# Patient Record
Sex: Female | Born: 1975 | Race: White | Hispanic: No | Marital: Married | Smoking: Never smoker
Health system: Southern US, Community
[De-identification: ages and names within clinical notes are randomized; demographics above are authoritative.]

## PROBLEM LIST (undated history)

## (undated) DIAGNOSIS — D649 Anemia, unspecified: Secondary | ICD-10-CM

## (undated) DIAGNOSIS — K219 Gastro-esophageal reflux disease without esophagitis: Secondary | ICD-10-CM

## (undated) DIAGNOSIS — N920 Excessive and frequent menstruation with regular cycle: Secondary | ICD-10-CM

## (undated) DIAGNOSIS — U071 COVID-19: Secondary | ICD-10-CM

## (undated) DIAGNOSIS — Z923 Personal history of irradiation: Secondary | ICD-10-CM

## (undated) DIAGNOSIS — Z973 Presence of spectacles and contact lenses: Secondary | ICD-10-CM

## (undated) DIAGNOSIS — D219 Benign neoplasm of connective and other soft tissue, unspecified: Secondary | ICD-10-CM

## (undated) HISTORY — PX: TUBAL LIGATION: SHX77

## (undated) HISTORY — DX: Benign neoplasm of connective and other soft tissue, unspecified: D21.9

## (undated) HISTORY — DX: Anemia, unspecified: D64.9

---

## 2001-02-15 ENCOUNTER — Other Ambulatory Visit: Admission: RE | Admit: 2001-02-15 | Discharge: 2001-02-15 | Payer: Self-pay | Admitting: Obstetrics and Gynecology

## 2001-07-01 ENCOUNTER — Encounter: Payer: Self-pay | Admitting: *Deleted

## 2001-07-01 ENCOUNTER — Ambulatory Visit (HOSPITAL_COMMUNITY): Admission: RE | Admit: 2001-07-01 | Discharge: 2001-07-01 | Payer: Self-pay | Admitting: *Deleted

## 2001-09-14 ENCOUNTER — Inpatient Hospital Stay (HOSPITAL_COMMUNITY): Admission: AD | Admit: 2001-09-14 | Discharge: 2001-09-17 | Payer: Self-pay | Admitting: Obstetrics and Gynecology

## 2001-10-18 ENCOUNTER — Other Ambulatory Visit: Admission: RE | Admit: 2001-10-18 | Discharge: 2001-10-18 | Payer: Self-pay | Admitting: Obstetrics and Gynecology

## 2006-08-17 HISTORY — PX: TUBAL LIGATION: SHX77

## 2019-04-19 ENCOUNTER — Encounter: Payer: Self-pay | Admitting: *Deleted

## 2019-05-19 ENCOUNTER — Encounter: Payer: Self-pay | Admitting: Obstetrics and Gynecology

## 2019-05-26 ENCOUNTER — Ambulatory Visit (INDEPENDENT_AMBULATORY_CARE_PROVIDER_SITE_OTHER): Payer: Self-pay | Admitting: Obstetrics and Gynecology

## 2019-05-26 ENCOUNTER — Other Ambulatory Visit: Payer: Self-pay

## 2019-05-26 ENCOUNTER — Encounter: Payer: Self-pay | Admitting: Obstetrics and Gynecology

## 2019-05-26 ENCOUNTER — Other Ambulatory Visit (HOSPITAL_COMMUNITY)
Admission: RE | Admit: 2019-05-26 | Discharge: 2019-05-26 | Disposition: A | Discharge status: Home / Self Care | Payer: Self-pay | Source: Ambulatory Visit | Attending: Obstetrics and Gynecology | Admitting: Obstetrics and Gynecology

## 2019-05-26 VITALS — BP 126/77 | HR 110 | Temp 98.5°F | Ht 61.0 in | Wt 119.9 lb

## 2019-05-26 DIAGNOSIS — N921 Excessive and frequent menstruation with irregular cycle: Secondary | ICD-10-CM | POA: Insufficient documentation

## 2019-05-26 DIAGNOSIS — N840 Polyp of corpus uteri: Secondary | ICD-10-CM

## 2019-05-26 DIAGNOSIS — D649 Anemia, unspecified: Secondary | ICD-10-CM | POA: Insufficient documentation

## 2019-05-26 MED ORDER — NORGESTIMATE-ETH ESTRADIOL 0.25-35 MG-MCG PO TABS: 1.0000 | ORAL_TABLET | Freq: Every day | ORAL | 3 refills | Status: DC

## 2019-05-26 NOTE — Progress Notes (Signed)
Obstetrics and Gynecology New Patient Evaluation  Appointment Date: 05/26/2019  OBGYN Clinic: [facility] for [facility]  Primary Care Provider: [facility]  Referring Provider: Philmore Pali, NP  Chief Complaint:  AUB  History of Present Illness: Frances Craig is a 43 y.o. Caucasian G2P2 (Patient's last menstrual period was 04/07/2019.), seen for the above chief complaint. Her past medical history is significant for nothing  Patient states s/s began a little over a year ago. She was put on Tri-sprintec in October and then provera added at her march visit with instructions to take the provera during the last part of her cycle to see if that would help with bleeding  No pain currently but still having some bleeding; no s/s of anemia, hot flashes, night sweats  Pt denies ever having had an u/s. Per referral notes she had normal TSH in 2019 and pap is up to date. She had a Hgb of 9.7 in march 2020.   Review of Systems: as noted in the History of Present Illness.  Patient Active Problem List   Diagnosis Date Noted  . Chronic anemia 05/26/2019  . Menometrorrhagia 05/26/2019    Past Medical History:  Past Medical History:  Diagnosis Date  . Anemia     Past Surgical History:  Past Surgical History:  Procedure Laterality Date  . TUBAL LIGATION      Past Obstetrical History:  OB History  Gravida Para Term Preterm AB Living  2         2  SAB TAB Ectopic Multiple Live Births          2    # Outcome Date GA Lbr Len/2nd Weight Sex Delivery Anes PTL Lv  2 Gravida           1 Gravida             Obstetric Comments  Vaginal delivery x 2    Past Gynecological History: As per HPI. Periods: AUB started approximately mid 2019. Prior to this, her periods were qmonth, regular, no intermenstrual bleeding, <1wk and not heavy or painful.  AUB causes bleeding most days and has heavy periods and then bleeding can start back after that and waxes and wanes  in intesnity History of Pap Smear(s): Yes.   Last pap last year per patient and was normal; no h/o abnormal paps She is currently using bilateral tubal ligation for contraception.   Social History:  Social History   Socioeconomic History  . Marital status: Married    Spouse name: Not on file  . Number of children: Not on file  . Years of education: Not on file  . Highest education level: Not on file  Occupational History  . Not on file  Social Needs  . Financial resource strain: Not on file  . Food insecurity    Worry: Not on file    Inability: Not on file  . Transportation needs    Medical: Not on file    Non-medical: Not on file  Tobacco Use  . Smoking status: Not on file  . Smokeless tobacco: Never Used  Substance and Sexual Activity  . Alcohol use: Never    Frequency: Never  . Drug use: Never  . Sexual activity: Yes    Birth control/protection: Pill  Lifestyle  . Physical activity    Days per week: Not on file    Minutes per session: Not on file  . Stress: Not on file  Relationships  . Social  connections    Talks on phone: Not on file    Gets together: Not on file    Attends religious service: Not on file    Active member of club or organization: Not on file    Attends meetings of clubs or organizations: Not on file    Relationship status: Not on file  . Intimate partner violence    Fear of current or ex partner: Not on file    Emotionally abused: Not on file    Physically abused: Not on file    Forced sexual activity: Not on file  Other Topics Concern  . Not on file  Social History Narrative  . Not on file    Family History:  She denies any female cancers  Medications Tri-sprintec, Provera  Allergies Patient has no allergy information on record.   Physical Exam:  BP 126/77   Pulse (!) 110   Temp 98.5 F (36.9 C)   Ht 5\' 1"  (1.549 m)   Wt 119 lb 14.4 oz (54.4 kg)   LMP 04/07/2019   BMI 22.65 kg/m  Body mass index is 22.65 kg/m. General  appearance: Well nourished, well developed female in no acute distress.  Neck:  Supple, normal appearance, and no thyromegaly  Cardiovascular: normal s1 and s2.  No murmurs, rubs or gallops. Respiratory:  Clear to auscultation bilateral. Normal respiratory effort Abdomen: positive bowel sounds and no masses, hernias; diffusely non tender to palpation, non distended Neuro/Psych:  Normal mood and affect.  Skin:  Warm and dry.  Lymphatic:  No inguinal lymphadenopathy.   Pelvic exam: is not limited by body habitus EGBUS: within normal limits, Vagina: within normal limits and with yellow-brown d/c in vault blood or discharge in the vault, Cervix: normal appearing cervix without tenderness, discharge or lesions. Uterus:  enlarged, mobile, c/w 10-12 week size and non tender and Adnexa:  normal adnexa and no mass, fullness, tenderness Rectovaginal: deferred, two 1cm hemorrhods right to posterior-medial  Laboratory: per HPI  Radiology: none.   Assessment:   Plan:  1. Menometrorrhagia Will try and get lab records from PCP, and d/w her last part of work up is an embx and u/s. I told her that her uterus feels a little bigger than usual but that the u/s will give [place] more information. If work up is negative, like I suspect it will be, then can talk to her about medical and surgical options. Pt is self pay  In the interim, will switch to regular sprintec and d/c the provera. Pt just started a new tri sprintec pack and told can stop it now and go to the sprintec - Surgical pathology( [place])  RTC after u/s  Durene Romans MD Attending Center for [facility])

## 2019-05-26 NOTE — Procedures (Signed)
Endometrial Biopsy Procedure Note  Pre-operative Diagnosis: AUB  Post-operative Diagnosis: same  Procedure Details  Urine pregnancy test was done and result was negative.  The risks (including infection, bleeding, pain, and uterine perforation) and benefits of the procedure were explained to the patient and Written informed consent was obtained.  The patient was placed in the dorsal lithotomy position.  Bimanual exam showed the uterus to be in the neutral position.  A Graves' speculum inserted in the vagina, and the cervix was visualized. The cervix was then prepped with povidone iodine, and a sharp tenaculum was applied to the anterior lip of the cervix for stabilization.  A pipelle was inserted into the uterine cavity and sounded the uterus to a depth of 10cm.  A Small amount of tissue mixed with moderate amount of blood was collected after 3 passes. The sample was sent for pathologic examination.  Condition: Stable  Complications: None  Plan: The patient was advised to call for any fever or for prolonged or severe pain or bleeding. She was advised to use OTC analgesics as needed for mild to moderate pain. She was advised to avoid vaginal intercourse for 48 hours or until the bleeding has completely stopped.  Durene Romans MD Attending Center for [employer] farm [employer])

## 2019-05-29 LAB — SURGICAL PATHOLOGY

## 2019-06-01 ENCOUNTER — Other Ambulatory Visit: Payer: Self-pay

## 2019-06-01 ENCOUNTER — Ambulatory Visit (HOSPITAL_COMMUNITY)
Admission: RE | Admit: 2019-06-01 | Discharge: 2019-06-01 | Disposition: A | Discharge status: Home / Self Care | Payer: Self-pay | Source: Ambulatory Visit | Attending: Obstetrics and Gynecology | Admitting: Obstetrics and Gynecology

## 2019-06-01 DIAGNOSIS — N921 Excessive and frequent menstruation with irregular cycle: Secondary | ICD-10-CM | POA: Insufficient documentation

## 2019-06-04 ENCOUNTER — Encounter: Payer: Self-pay | Admitting: Obstetrics and Gynecology

## 2019-06-04 DIAGNOSIS — D259 Leiomyoma of uterus, unspecified: Secondary | ICD-10-CM | POA: Insufficient documentation

## 2019-07-04 ENCOUNTER — Telehealth: Payer: Self-pay | Admitting: Obstetrics and Gynecology

## 2019-07-04 NOTE — Telephone Encounter (Signed)
Attempted to contact patient about her appointment on 11/18 @ 9:15. No answer, left voicemail instructing that the appointment is a mychart visit. Patient instructed to download the mychart app if not already done so. Patient instructed a nurse will give her a call around her appointment time. Patient instructed to give the office a call with any concerns.

## 2019-07-05 ENCOUNTER — Other Ambulatory Visit: Payer: Self-pay

## 2019-07-05 ENCOUNTER — Telehealth (INDEPENDENT_AMBULATORY_CARE_PROVIDER_SITE_OTHER): Payer: Self-pay | Admitting: Obstetrics and Gynecology

## 2019-07-05 ENCOUNTER — Encounter: Payer: Self-pay | Admitting: Obstetrics and Gynecology

## 2019-07-05 DIAGNOSIS — D259 Leiomyoma of uterus, unspecified: Secondary | ICD-10-CM

## 2019-07-05 DIAGNOSIS — N840 Polyp of corpus uteri: Secondary | ICD-10-CM | POA: Insufficient documentation

## 2019-07-05 DIAGNOSIS — N921 Excessive and frequent menstruation with irregular cycle: Secondary | ICD-10-CM

## 2019-07-05 NOTE — Progress Notes (Signed)
TELEHEALTH VIRTUAL GYNECOLOGY VISIT ENCOUNTER NOTE  I connected with Frances Craig on 07/05/19 at  9:15 AM EST by telephone at home and verified that I am speaking with the correct person using two identifiers.   I discussed the limitations, risks, security and privacy concerns of performing an evaluation and management service by telephone and the availability of in person appointments. I also discussed with the patient that there may be a patient responsible charge related to this service. The patient expressed understanding and agreed to proceed.  Chief Complaint: follow up labs, ultrasound  History:  Frances Craig is a 43 y.o. G2P2 with the above CC. Endometrial biopsy at last visit on 10/9 showed an endometrial polyp and ultrasound showed multiple fibroids (see below).  Having some ?mood changes with the sprintec and GYN s/s sound stable vs with the tri sprintec.      Past Medical History:  Diagnosis Date  . Anemia    Past Surgical History:  Procedure Laterality Date  . TUBAL LIGATION     The following portions of the patient's history were reviewed and updated as appropriate: allergies, current medications, past family history, past medical history, past social history, past surgical history and problem list.    Review of Systems:  Pertinent items noted in HPI and remainder of comprehensive ROS otherwise negative.  Physical Exam:   General:  Alert, oriented and cooperative.   Mental Status: Normal mood and affect perceived. Normal judgment and thought content.  Physical exam deferred due to nature of the encounter  Labs and Imaging As per HPI  CLINICAL DATA:  Menometrorrhagia. Prior bilateral tubal ligation. Gravida 2 para 2. History of endometrial biopsy showing endometrioid type polyp. Irregular cycles since mid 2019. LMP 04/07/2019.  EXAM: TRANSABDOMINAL AND TRANSVAGINAL ULTRASOUND OF PELVIS  TECHNIQUE: Both transabdominal and transvaginal  ultrasound examinations of the pelvis were performed. Transabdominal technique was performed for global imaging of the pelvis including uterus, ovaries, adnexal regions, and pelvic cul-de-sac. It was necessary to proceed with endovaginal exam following the transabdominal exam to visualize the endometrium and ovaries.  COMPARISON:  None  FINDINGS: Uterus  Measurements: Uterus is enlarged, measuring at least 13.0 x 7.0 x 9.9 centimeters = volume: 468 mL. Multiple solid masses are present.  RIGHT fundal fibroid is 8.2 x 6.2 x 5.6 centimeters.  Central fibroid is 4.8 x 4.7 x 4.5 centimeters.  Fundal anterior fibroid is 3.0 x 1.9 x 1.6 centimeters.  Endometrium  Thickness: 6.2 millimeters. No focal abnormality identified. The endometrium is displaced by large fibroids.  Right ovary  Measurements: 3.4 x 2.1 x 1.8 centimeters = volume: 6.9 mL. Normal appearance/no adnexal mass.  Left ovary  Measurements: 3.0 x 1.1 x 1.6 centimeter = volume: 2.9 mL. Normal appearance/no adnexal mass.  Other findings  No abnormal free fluid.  IMPRESSION: 1. Multiple uterine fibroids enlarging the uterus. 2. Normal endometrial stripe thickness. 3. Normal appearance of both ovaries.   Electronically Signed   By: Nolon Nations M.D.   On: 06/01/2019 15:45  Assessment and Plan:     1. Menometrorrhagia I d/w her that I the only medications I would consider next is a different pill formulation or depo provera (pt hasn't ever tried this), but I'm not optimistic given that there is likely a structural reasons for her bleeding, such as persistent polyp not obviously seen on u/s or due to the fibroids. I also told her I wouldn't recommend an IUD for the above reasons and possibility that  the uterine cavity could be distorted and make IUD placement difficult.  In terms of surgery I told her I'd recommend a hysterectomy or a hysteroscopy, remove any fibroids/polyps and an  endometrial ablation. I told her that ablation has a high chance of success but may wear off in about 3-5 years and she wold need a hysterectomy at that point. As for the hysterectomy, she has a high chance of being able to have it done vaginally or laparoscopically and I would remove her fallopian tubes and leave her ovaries.    Patient to consider options and let us know how she'd like to proceed. I told her that if she would like to move forward with hyst, I'd like to see her back in the office to see about which way seems best: vaginally or laparoscopically   Will ask front desk to again try and get pap records from PCP  2. Endometrial polyp  3. Uterine leiomyoma, unspecified location      I discussed the assessment and treatment plan with the patient. The patient was provided an opportunity to ask questions and all were answered. The patient agreed with the plan and demonstrated an understanding of the instructions.   The patient was advised to call back or seek an in-person evaluation/go to the ED if the symptoms worsen or if the condition fails to improve as anticipated.  I provided 20 minutes of non-face-to-face time during this encounter. The visit was done via a MyChart visit.    Aletha Halim, MD [facility]

## 2019-07-05 NOTE — Progress Notes (Signed)
I connected with  Patricia Pesa on 07/05/19 at  9:15 AM EST by telephone and verified that I am speaking with the correct person using two identifiers.   I discussed the limitations, risks, security and privacy concerns of performing an evaluation and management service by telephone and the availability of in person appointments. I also discussed with the patient that there may be a patient responsible charge related to this service. The patient expressed understanding and agreed to proceed.  Verdell Carmine, RN 07/05/2019  8:44 AM

## 2020-06-02 ENCOUNTER — Other Ambulatory Visit: Payer: Self-pay | Admitting: Obstetrics and Gynecology

## 2020-06-07 ENCOUNTER — Other Ambulatory Visit: Payer: Self-pay

## 2020-06-07 MED ORDER — NORGESTIMATE-ETH ESTRADIOL 0.25-35 MG-MCG PO TABS: 1.0000 | ORAL_TABLET | Freq: Every day | ORAL | 3 refills | Status: DC

## 2020-08-17 DIAGNOSIS — Z862 Personal history of diseases of the blood and blood-forming organs and certain disorders involving the immune mechanism: Secondary | ICD-10-CM

## 2020-08-17 DIAGNOSIS — D649 Anemia, unspecified: Secondary | ICD-10-CM

## 2020-08-17 HISTORY — DX: Personal history of diseases of the blood and blood-forming organs and certain disorders involving the immune mechanism: Z86.2

## 2020-08-17 HISTORY — DX: Anemia, unspecified: D64.9

## 2021-05-01 ENCOUNTER — Other Ambulatory Visit: Payer: Self-pay

## 2021-05-01 ENCOUNTER — Emergency Department
Admission: EM | Admit: 2021-05-01 | Discharge: 2021-05-01 | Disposition: A | Discharge status: Home / Self Care | Payer: Self-pay | Attending: Emergency Medicine | Admitting: Emergency Medicine

## 2021-05-01 DIAGNOSIS — N939 Abnormal uterine and vaginal bleeding, unspecified: Secondary | ICD-10-CM

## 2021-05-01 DIAGNOSIS — D649 Anemia, unspecified: Secondary | ICD-10-CM | POA: Insufficient documentation

## 2021-05-01 DIAGNOSIS — N938 Other specified abnormal uterine and vaginal bleeding: Secondary | ICD-10-CM | POA: Insufficient documentation

## 2021-05-01 DIAGNOSIS — N9489 Other specified conditions associated with female genital organs and menstrual cycle: Secondary | ICD-10-CM | POA: Insufficient documentation

## 2021-05-01 LAB — CBC WITH DIFFERENTIAL/PLATELET
Abs Immature Granulocytes: 0.04 10*3/uL (ref 0.00–0.07)
Basophils Absolute: 0 10*3/uL (ref 0.0–0.1)
Basophils Relative: 1 %
Eosinophils Absolute: 0.1 10*3/uL (ref 0.0–0.5)
Eosinophils Relative: 2 %
HCT: 27.4 % — ABNORMAL LOW (ref 36.0–46.0)
Hemoglobin: 8.6 g/dL — ABNORMAL LOW (ref 12.0–15.0)
Immature Granulocytes: 1 %
Lymphocytes Relative: 20 %
Lymphs Abs: 1.6 10*3/uL (ref 0.7–4.0)
MCH: 30.3 pg (ref 26.0–34.0)
MCHC: 31.4 g/dL (ref 30.0–36.0)
MCV: 96.5 fL (ref 80.0–100.0)
Monocytes Absolute: 0.4 10*3/uL (ref 0.1–1.0)
Monocytes Relative: 5 %
Neutro Abs: 5.7 10*3/uL (ref 1.7–7.7)
Neutrophils Relative %: 71 %
Platelets: 325 10*3/uL (ref 150–400)
RBC: 2.84 MIL/uL — ABNORMAL LOW (ref 3.87–5.11)
RDW: 13.7 % (ref 11.5–15.5)
WBC: 8 10*3/uL (ref 4.0–10.5)
nRBC: 0 % (ref 0.0–0.2)

## 2021-05-01 LAB — COMPREHENSIVE METABOLIC PANEL
ALT: 7 U/L (ref 0–44)
AST: 11 U/L — ABNORMAL LOW (ref 15–41)
Albumin: 2.7 g/dL — ABNORMAL LOW (ref 3.5–5.0)
Alkaline Phosphatase: 23 U/L — ABNORMAL LOW (ref 38–126)
Anion gap: 4 — ABNORMAL LOW (ref 5–15)
BUN: 16 mg/dL (ref 6–20)
CO2: 21 mmol/L — ABNORMAL LOW (ref 22–32)
Calcium: 7.2 mg/dL — ABNORMAL LOW (ref 8.9–10.3)
Chloride: 109 mmol/L (ref 98–111)
Creatinine, Ser: 0.78 mg/dL (ref 0.44–1.00)
GFR, Estimated: >60 mL/min (ref >60)
Glucose, Bld: 174 mg/dL — ABNORMAL HIGH (ref 70–99)
Potassium: 3.9 mmol/L (ref 3.5–5.1)
Sodium: 134 mmol/L — ABNORMAL LOW (ref 135–145)
Total Bilirubin: 0.1 mg/dL — ABNORMAL LOW (ref 0.3–1.2)
Total Protein: 4.5 g/dL — ABNORMAL LOW (ref 6.5–8.1)

## 2021-05-01 LAB — TYPE AND SCREEN
ABO/RH(D): O POS
Antibody Screen: NEGATIVE

## 2021-05-01 LAB — HCG, QUANTITATIVE, PREGNANCY: hCG, Beta Chain, Quant, S: <1 m[IU]/mL (ref <5)

## 2021-05-01 NOTE — ED Triage Notes (Signed)
Patient to ED via EMS for c/o heavy vaginal bleeding and abd pain. Patient states hx of fibroids.

## 2021-05-01 NOTE — ED Provider Notes (Signed)
[facility]  ____________________________________________   Event Date/Time   First MD Initiated Contact with Patient 05/01/21 0250     (approximate)  I have reviewed the triage vital signs and the nursing notes.   HISTORY  Chief Complaint Vaginal Bleeding    HPI Frances Craig is a 45 y.o. female with past medical history of uterine fibroid and uterine polyp, menometrorrhagia who presents with vaginal bleeding.  Patient tells me she is on day 3 of her menstrual cycle.  Since 930 last night she has had heavy bleeding, changing her pad and tampon almost 4 times per hour.  She endorses passing clots.  She has associated low back pain and chills.  She tells me that she still gets a regular monthly menstrual period last for about 7 days.  She has usually 1 day of heavy bleeding and the rest are about normal.  She has been diagnosed with fibroids and uterine polyp before and it was recommended that she have a hysterectomy.  She is currently on birth control.  She does not think she could be pregnant.  Her last menstrual period was last month.         Past Medical History:  Diagnosis Date   Anemia     Patient Active Problem List   Diagnosis Date Noted   Endometrial polyp 07/05/2019   Fibroid uterus 06/04/2019   Chronic anemia 05/26/2019   Menometrorrhagia 05/26/2019    Past Surgical History:  Procedure Laterality Date   TUBAL LIGATION      Prior to Admission medications   Medication Sig Start Date End Date Taking? Authorizing Provider  norgestimate-ethinyl estradiol (ORTHO-CYCLEN) 0.25-35 MG-MCG tablet Take 1 tablet by mouth daily. 06/07/20   Aletha Halim, MD    Allergies Patient has no known allergies.  History reviewed. No pertinent family history.  Social History Social History   Tobacco Use   Smoking status: Never   Smokeless tobacco: Never  Substance Use Topics   Alcohol use: Never   Drug use: Never    Review of Systems    Review of Systems  Constitutional:  Positive for chills. Negative for fatigue and fever.  Respiratory:  Negative for shortness of breath.   Cardiovascular:  Negative for chest pain.  Genitourinary:  Positive for vaginal bleeding. Negative for dysuria.  Musculoskeletal:  Positive for back pain.  Neurological:  Negative for light-headedness.  All other systems reviewed and are negative.  Physical Exam Updated Vital Signs BP 111/64   Pulse 75   Temp 97.6 F (36.4 C) (Oral)   Resp 19   Ht '5\' 1"'$  (1.549 m)   Wt 53.1 kg   LMP 04/29/2021 (Exact Date)   SpO2 100%   BMI 22.11 kg/m   Physical Exam Vitals and nursing note reviewed.  Constitutional:      General: She is not in acute distress.    Appearance: Normal appearance.     Comments: Appears uncomfortable, shaking due to cold  HENT:     Head: Normocephalic and atraumatic.     Mouth/Throat:     Comments: Pale lips Eyes:     General: No scleral icterus.    Conjunctiva/sclera: Conjunctivae normal.  Pulmonary:     Effort: Pulmonary effort is normal. No respiratory distress.     Breath sounds: No stridor.  Abdominal:     General: Abdomen is flat.     Palpations: Abdomen is soft.     Tenderness: There is no abdominal tenderness. There is no  guarding.  Musculoskeletal:        General: No deformity or signs of injury.     Cervical back: Normal range of motion.  Skin:    General: Skin is dry.     Coloration: Skin is not jaundiced or pale.  Neurological:     General: No focal deficit present.     Mental Status: She is alert and oriented to person, place, and time. Mental status is at baseline.  Psychiatric:        Mood and Affect: Mood normal.        Behavior: Behavior normal.     LABS (all labs ordered are listed, but only abnormal results are displayed)  Labs Reviewed  COMPREHENSIVE METABOLIC PANEL - Abnormal; Notable for the following components:      Result Value   Sodium 134 (*)    CO2 21 (*)    Glucose, Bld  174 (*)    Calcium 7.2 (*)    Total Protein 4.5 (*)    Albumin 2.7 (*)    AST 11 (*)    Alkaline Phosphatase 23 (*)    Total Bilirubin 0.1 (*)    Anion gap 4 (*)    All other components within normal limits  CBC WITH DIFFERENTIAL/PLATELET - Abnormal; Notable for the following components:   RBC 2.84 (*)    Hemoglobin 8.6 (*)    HCT 27.4 (*)    All other components within normal limits  HCG, QUANTITATIVE, PREGNANCY  URINALYSIS, COMPLETE (UACMP) WITH MICROSCOPIC  PREGNANCY, URINE  TYPE AND SCREEN   ____________________________________________  EKG  N/a ____________________________________________  RADIOLOGY Almeta Monas, personally viewed and evaluated these images (plain radiographs) as part of my medical decision making, as well as reviewing the written report by the radiologist.  ED MD interpretation:  n/a    ____________________________________________   PROCEDURES  Procedure(s) performed (including Critical Care):  Procedures   ____________________________________________   INITIAL IMPRESSION / ASSESSMENT AND PLAN / ED COURSE     45 year old premenopausal female presenting with heavy vaginal bleeding for the past 5 hours.  She has a history of uterine fibroids and uterine polyp which is likely the cause of her heavy bleeding.  Currently she is hemodynamically stable although her blood pressure is on the softer side 109/81.  She is not tachycardic.  She looks somewhat uncomfortable and appears to be rigoring however she is alert and oriented with warm extremities and her abdomen is benign.  No indication for emergent transfusion at this time.  We will send CBC and type and screen to assess for the degree of blood loss.  We will continue to monitor.  Patient's hemoglobin is 8.6.  She currently is taking iron supplements.  She is not pregnant.  The rest of the labs are reassuring.  She remained hemodynamically stable.  During her ED stay patient did not have  any ongoing bleeding.  I feel comfortable with her following up with her outpatient GYN given that the bleeding is stopped she is hemodynamically stable and does not require transfusion.  Patient agreed with the plan.  Clinical Course as of 05/01/21 0542  Thu May 01, 2021  0346 Hemoglobin(!): 8.6 [KM]    Clinical Course User Index [KM] Rada Hay, MD     ____________________________________________   FINAL CLINICAL IMPRESSION(S) / ED DIAGNOSES  Final diagnoses:  Vaginal bleeding  Anemia, unspecified type     ED Discharge Orders     None  Note:  This document was prepared using Dragon voice recognition software and may include unintentional dictation errors.    Rada Hay, MD 05/01/21 [phone]

## 2021-05-03 ENCOUNTER — Other Ambulatory Visit: Payer: Self-pay | Admitting: Obstetrics and Gynecology

## 2021-05-30 ENCOUNTER — Ambulatory Visit (INDEPENDENT_AMBULATORY_CARE_PROVIDER_SITE_OTHER): Payer: Self-pay | Admitting: Obstetrics and Gynecology

## 2021-05-30 ENCOUNTER — Other Ambulatory Visit: Payer: Self-pay

## 2021-05-30 ENCOUNTER — Encounter: Payer: Self-pay | Admitting: Obstetrics and Gynecology

## 2021-05-30 VITALS — BP 122/76 | HR 73 | Wt 117.8 lb

## 2021-05-30 DIAGNOSIS — D500 Iron deficiency anemia secondary to blood loss (chronic): Secondary | ICD-10-CM

## 2021-05-30 DIAGNOSIS — N938 Other specified abnormal uterine and vaginal bleeding: Secondary | ICD-10-CM

## 2021-05-30 DIAGNOSIS — D509 Iron deficiency anemia, unspecified: Secondary | ICD-10-CM

## 2021-05-30 MED ORDER — TRANEXAMIC ACID 650 MG PO TABS: 1300.0000 mg | ORAL_TABLET | Freq: Three times a day (TID) | ORAL | 11 refills | Status: AC

## 2021-05-30 MED ORDER — DOCUSATE SODIUM 100 MG PO CAPS: 100.0000 mg | ORAL_CAPSULE | Freq: Two times a day (BID) | ORAL | 1 refills | Status: DC | PRN

## 2021-05-30 MED ORDER — NORGESTIMATE-ETH ESTRADIOL 0.25-35 MG-MCG PO TABS: 1.0000 | ORAL_TABLET | Freq: Every day | ORAL | 3 refills | Status: DC

## 2021-05-30 MED ORDER — FERROUS GLUCONATE 324 (38 FE) MG PO TABS: 324.0000 mg | ORAL_TABLET | ORAL | 0 refills | Status: DC

## 2021-05-30 NOTE — Patient Instructions (Signed)
[place]  Uterine artery embolization

## 2021-06-01 NOTE — Progress Notes (Signed)
Obstetrics and Gynecology Visit Return Patient Evaluation  Appointment Date: 05/30/2021  Primary Care Provider: [facility]: [facility]  Chief Complaint: continued menorrhagia and dysmenorrhea  History of Present Illness:  Frances Craig is a 45 y.o. P2 with above CC. Patient last seen by me 06/2019 for f/u for her AUB after benign embx but did show polyp and u/s that showed fibroids. Patient is self pay and options d/w her and pt decided to continue on combined OCPs.  She has regular qmonth periods but they are heavy and painful to wear she has to stay home for the heaviest few days. Patient most recently seen in ed on 9/15 for this.  Review of Systems: as noted in the History of Present Illness.   Patient Active Problem List   Diagnosis Date Noted   Endometrial polyp 07/05/2019   Fibroid uterus 06/04/2019   Chronic anemia 05/26/2019   Menometrorrhagia 05/26/2019   Medications:  [place] F. Loredo "Vicky" had no medications administered during this visit. Current Outpatient Medications  Medication Sig Dispense Refill   ferrous gluconate (FERGON) 324 MG tablet Take 1 tablet (324 mg total) by mouth every other day. 30 tablet 0   norgestimate-ethinyl estradiol (ORTHO-CYCLEN) 0.25-35 MG-MCG tablet Take 1 tablet by mouth daily. 84 tablet 3   No current facility-administered medications for this visit.    Allergies: has No Known Allergies.  Physical Exam:  BP 122/76   Pulse 73   Wt 117 lb 12.8 oz (53.4 kg)   LMP 05/28/2021 (Exact Date)   BMI 22.26 kg/m  Body mass index is 22.26 kg/m. General appearance: Well nourished, well developed female in no acute distress.  Abdomen: diffusely non tender to palpation, non distended, and no hernias Neuro/Psych:  Normal mood and affect.    Pelvic exam:  EGBUS: normal Vaginal vault: moderate amount of VB, on active bleeding Cervix:  normal Bimanual: 12-14wk sized, mobile uterus,  nttp; no adnexal masses   Assessment: pt stable  Plan:  1. Iron deficiency anemia, unspecified iron deficiency anemia type Pt has PCP in [place] and has known anemia and already on iron before going to ED so anemia is likely chronic given her being asymptomatic; see below  2. Anemia, blood loss Unfortunately, patient is self pay and resources for financial assistance given to her. I told her I definitely recommend something be done for bleeding, especially since she anemic and doesn't have much reserve in case bleeding is heavier. I told her I recommend surgical options and that hysterectomy is a great option with major risks of surgery d/w her. I also d/w her re: [place] and IR [place] but she would need an updated u/s first to see if she's a candidate for it. In the mean time, I d/w her re: continuous OCP use with Sprintec and Lysteda.  Given her bleeding, and she is on her placebo week for the past few days, I told her I would recommend trying Lysteda for a five day course to see if that helps. If it does, I told her that she could do that cyclically instead of OCPs (pt has a h/o a BTL).  If it's too expensive, pt told to let me know and she can try taking her OCP hormone pills now instead of waiting to finish out the week and to take them continuously and to take the placebo pills q3-40m.    RTC: PRN  Durene Romans MD Attending Center for [employer] (  Water engineer)

## 2021-06-23 ENCOUNTER — Telehealth: Payer: Self-pay

## 2021-06-23 NOTE — Telephone Encounter (Signed)
Telephoned patient and left voice message with [facility] scheduling information.

## 2021-07-07 ENCOUNTER — Other Ambulatory Visit: Payer: Self-pay

## 2021-07-07 ENCOUNTER — Ambulatory Visit (INDEPENDENT_AMBULATORY_CARE_PROVIDER_SITE_OTHER): Payer: Self-pay | Admitting: Obstetrics and Gynecology

## 2021-07-07 ENCOUNTER — Encounter: Payer: Self-pay | Admitting: Obstetrics and Gynecology

## 2021-07-07 VITALS — BP 124/87 | Wt 119.7 lb

## 2021-07-07 DIAGNOSIS — Z1231 Encounter for screening mammogram for malignant neoplasm of breast: Secondary | ICD-10-CM

## 2021-07-07 DIAGNOSIS — N946 Dysmenorrhea, unspecified: Secondary | ICD-10-CM | POA: Insufficient documentation

## 2021-07-07 DIAGNOSIS — D259 Leiomyoma of uterus, unspecified: Secondary | ICD-10-CM

## 2021-07-07 DIAGNOSIS — D649 Anemia, unspecified: Secondary | ICD-10-CM

## 2021-07-07 DIAGNOSIS — N920 Excessive and frequent menstruation with regular cycle: Secondary | ICD-10-CM

## 2021-07-07 NOTE — Progress Notes (Signed)
Obstetrics and Gynecology Visit Return Patient Evaluation  Appointment Date: 07/07/2021  Primary Care Provider: [place] for [facility]  Chief Complaint: heavy, painful periods, fibroids, chronic anemia  History of Present Illness:  Frances Craig is a 45 y.o. P2 with above CC. Patient last seen by me 05/2021 for f/u for her . She previously had a embx that showed polypoid tissue in 2020.  Patient is self insured and I last saw her a month ago and patient was going to try and work on the financial assistance application and we switched from COCs to [employer] to see if that would help  Interval History: Since that time, she states that the [place] didn't really help so she is back on the COCs and submitted her application a few days after the visit.   Review of Systems: as noted in the History of Present Illness.  Patient Active Problem List   Diagnosis Date Noted   Dysmenorrhea 07/07/2021   Endometrial polyp 07/05/2019   Fibroid uterus 06/04/2019   Chronic anemia 05/26/2019   Menometrorrhagia 05/26/2019   Medications:  Frances Craig "Frances Craig" had no medications administered during this visit. Current Outpatient Medications  Medication Sig Dispense Refill   docusate sodium (COLACE) 100 MG capsule Take 1 capsule (100 mg total) by mouth 2 (two) times daily as needed. 40 capsule 1   ferrous gluconate (FERGON) 324 MG tablet Take 1 tablet (324 mg total) by mouth every other day. 30 tablet 0   norgestimate-ethinyl estradiol (ORTHO-CYCLEN) 0.25-35 MG-MCG tablet Take 1 tablet by mouth daily. 84 tablet 3   No current facility-administered medications for this visit.    Allergies: has No Known Allergies.  Physical Exam:  BP 124/87   Wt 119 lb 11.2 oz (54.3 kg)   LMP 06/17/2021   BMI 22.62 kg/m  Body mass index is 22.62 kg/m. General appearance: Well nourished, well developed female in no acute distress.  Abdomen:  diffusely non tender to palpation, non distended, and no masses, hernias Neuro/Psych:  Normal mood and affect.     Assessment: pt stable  Plan:  1. Menorrhagia with regular cycle Pt to follow up on application progress. She is really interested in [place] which I told her could be a very good option for her. She will follow up on the [place] financial assistance program and will try doing the OCPs as continuous to see if she can avoid a qmonth cycle and I'l reach out to [facility] to see if there are any assistance programs for uninsured  If not, patient is willing to proceed as a self pay option.   Patient also has bcccp contact information and will call them to schedule care.  2. Uterine leiomyoma, unspecified location  3. Chronic anemia  4. Dysmenorrhea   RTC: PRN  Durene Romans MD Attending Center for [facility])

## 2021-07-17 ENCOUNTER — Encounter: Payer: Self-pay | Admitting: Obstetrics and Gynecology

## 2021-07-29 ENCOUNTER — Other Ambulatory Visit: Payer: Self-pay | Admitting: Obstetrics and Gynecology

## 2021-08-04 ENCOUNTER — Other Ambulatory Visit: Payer: Self-pay | Admitting: Obstetrics and Gynecology

## 2021-08-04 MED ORDER — FERROUS GLUCONATE 324 (38 FE) MG PO TABS: 324.0000 mg | ORAL_TABLET | ORAL | 0 refills | Status: DC

## 2021-08-05 ENCOUNTER — Ambulatory Visit: Payer: Self-pay | Admitting: *Deleted

## 2021-08-05 ENCOUNTER — Other Ambulatory Visit: Payer: Self-pay

## 2021-08-05 ENCOUNTER — Ambulatory Visit: Admission: RE | Admit: 2021-08-05 | Payer: Self-pay | Source: Ambulatory Visit

## 2021-08-05 VITALS — BP 120/68 | Wt 120.8 lb

## 2021-08-05 DIAGNOSIS — N63 Unspecified lump in unspecified breast: Secondary | ICD-10-CM

## 2021-08-05 DIAGNOSIS — N6314 Unspecified lump in the right breast, lower inner quadrant: Secondary | ICD-10-CM

## 2021-08-05 DIAGNOSIS — Z01419 Encounter for gynecological examination (general) (routine) without abnormal findings: Secondary | ICD-10-CM

## 2021-08-05 DIAGNOSIS — N6311 Unspecified lump in the right breast, upper outer quadrant: Secondary | ICD-10-CM

## 2021-08-05 DIAGNOSIS — N6321 Unspecified lump in the left breast, upper outer quadrant: Secondary | ICD-10-CM

## 2021-08-05 NOTE — Progress Notes (Signed)
Frances Craig is a 45 y.o. G2P2002 female who presents to [facility] clinic today with complaint of AUB. Patient is currently being followed Dr. Ilda Basset at the [facility] for [place].    Pap Smear: Pap smear completed today. Last Pap smear was around 4 years ago at [place] clinic and was normal per patient. Per patient has no history of an abnormal Pap smear. Last Pap smear result is not available in Epic.   Physical exam: Breasts Breasts symmetrical. No skin abnormalities bilateral breasts. No nipple retraction bilateral breasts. No nipple discharge bilateral breasts. No lymphadenopathy. Palpated a mobile lump within the left breast at 2 o'clock 1 cm from the nipple. Palpated two lumps within the right breast at 5 o'clock under areola and a pea sized lump at 10 o'clock 8 cm from the nipple. No complaints of pain or tenderness on exam.      Pelvic/Bimanual Ext Genitalia No lesions, no swelling and no discharge observed on external genitalia.        Vagina Vagina pink and normal texture. No lesions and blood observed in vagina. Patient stated she has AUB most days of the month.       Cervix Cervix is present. Cervix pink and of normal texture. Polyp observed at cervical os. Blood observed at cervical os.    Uterus Uterus is present and palpable. Uterus in normal position and enlarged due to history of fibroids.      Adnexae Bilateral ovaries present and palpable. No tenderness on palpation.         Rectovaginal No rectal exam completed today since patient had no rectal complaints. No skin abnormalities observed on exam.     Smoking History: Patient has never smoked.   Patient Navigation: Patient education provided. Access to services provided for patient through [facility] program.    Breast and Cervical Cancer Risk Assessment: Patient does not have family history of breast cancer, known genetic mutations, or radiation treatment to the chest  before age 17. Patient does not have history of cervical dysplasia, immunocompromised, or DES exposure in-utero.  Risk Assessment     Risk Scores       08/05/2021   Last edited by: Demetrius Revel, LPN   5-year risk: 0.7 %   Lifetime risk: 8.7 %            A: [facility] exam with pap smear Complaint of AUB.  P: Referred patient to the [place] for a diagnostic mammogram. Appointment scheduled Tuesday, August 26, 2021 at [place].  Loletta Parish, RN 08/05/2021 1:32 PM

## 2021-08-05 NOTE — Patient Instructions (Addendum)
Explained breast self awareness with Frances Craig. Pap smear completed today. Let her know [facility] will cover Pap smears and HPV typing every 5 years unless has a history of abnormal Pap smears. Referred patient to the [facility] for a diagnostic mammogram. Appointment scheduled Tuesday, August 26, 2021 at [place]. Patient aware of appointment and will be there. Let patient know will follow up with her within the next couple weeks with results of Pap smear by phone. Frances Craig verbalized understanding.  Frances Craig, Frances [place], RN 1:32 PM

## 2021-08-07 ENCOUNTER — Telehealth: Payer: Self-pay

## 2021-08-07 LAB — CYTOLOGY - PAP
Comment: NEGATIVE
Diagnosis: NEGATIVE
High risk HPV: NEGATIVE

## 2021-08-07 NOTE — Telephone Encounter (Signed)
Patient informed negative Pap[/HPV results, next pap smear due in 3-5 years. Patient verbalized understanding.  

## 2021-08-19 ENCOUNTER — Encounter: Payer: Self-pay | Admitting: Obstetrics and Gynecology

## 2021-08-19 ENCOUNTER — Other Ambulatory Visit: Payer: Self-pay | Admitting: Obstetrics and Gynecology

## 2021-08-19 DIAGNOSIS — D259 Leiomyoma of uterus, unspecified: Secondary | ICD-10-CM

## 2021-08-26 ENCOUNTER — Ambulatory Visit
Admission: RE | Admit: 2021-08-26 | Discharge: 2021-08-26 | Disposition: A | Discharge status: Home / Self Care | Payer: No Typology Code available for payment source | Source: Ambulatory Visit | Attending: Obstetrics and Gynecology | Admitting: Obstetrics and Gynecology

## 2021-08-26 ENCOUNTER — Other Ambulatory Visit: Payer: Self-pay

## 2021-08-26 ENCOUNTER — Ambulatory Visit
Admission: RE | Admit: 2021-08-26 | Discharge: 2021-08-26 | Disposition: A | Discharge status: Home / Self Care | Payer: Self-pay | Source: Ambulatory Visit | Attending: Obstetrics and Gynecology | Admitting: Obstetrics and Gynecology

## 2021-08-26 ENCOUNTER — Other Ambulatory Visit: Payer: Self-pay | Admitting: Obstetrics and Gynecology

## 2021-08-26 DIAGNOSIS — N63 Unspecified lump in unspecified breast: Secondary | ICD-10-CM

## 2021-08-28 ENCOUNTER — Ambulatory Visit (HOSPITAL_COMMUNITY)
Admission: RE | Admit: 2021-08-28 | Discharge: 2021-08-28 | Disposition: A | Discharge status: Home / Self Care | Payer: Self-pay | Source: Ambulatory Visit | Attending: Obstetrics and Gynecology | Admitting: Obstetrics and Gynecology

## 2021-08-28 ENCOUNTER — Other Ambulatory Visit: Payer: Self-pay

## 2021-08-28 DIAGNOSIS — D259 Leiomyoma of uterus, unspecified: Secondary | ICD-10-CM | POA: Insufficient documentation

## 2021-08-29 ENCOUNTER — Encounter: Payer: Self-pay | Admitting: Obstetrics and Gynecology

## 2021-09-08 ENCOUNTER — Telehealth: Payer: Self-pay

## 2021-09-08 NOTE — Telephone Encounter (Addendum)
Patient returned call, stated that she now her has private insurance coverage, will be using that to cover services. [facility] Medicaid is not needed.   Attempted to contact patient regarding [facility] medicaid application. Left message on voicemail, requesting a return call.

## 2021-09-11 ENCOUNTER — Telehealth (HOSPITAL_BASED_OUTPATIENT_CLINIC_OR_DEPARTMENT_OTHER): Payer: Self-pay | Admitting: Obstetrics & Gynecology

## 2021-09-11 NOTE — Telephone Encounter (Signed)
Called patient and left a message to call the office back to schedule the appointment .  

## 2021-09-16 ENCOUNTER — Telehealth (HOSPITAL_BASED_OUTPATIENT_CLINIC_OR_DEPARTMENT_OTHER): Payer: Self-pay | Admitting: Obstetrics & Gynecology

## 2021-09-16 NOTE — Telephone Encounter (Signed)
Called patient and left a detail  message to please call the office back to set up appointment .

## 2021-09-18 ENCOUNTER — Encounter (HOSPITAL_BASED_OUTPATIENT_CLINIC_OR_DEPARTMENT_OTHER): Payer: Self-pay | Admitting: Obstetrics & Gynecology

## 2021-09-18 ENCOUNTER — Ambulatory Visit (INDEPENDENT_AMBULATORY_CARE_PROVIDER_SITE_OTHER): Payer: 59 | Admitting: Obstetrics & Gynecology

## 2021-09-18 ENCOUNTER — Other Ambulatory Visit: Payer: Self-pay

## 2021-09-18 VITALS — BP 127/89 | HR 111 | Ht 61.0 in | Wt 121.0 lb

## 2021-09-18 DIAGNOSIS — N920 Excessive and frequent menstruation with regular cycle: Secondary | ICD-10-CM

## 2021-09-18 DIAGNOSIS — N6489 Other specified disorders of breast: Secondary | ICD-10-CM | POA: Diagnosis not present

## 2021-09-18 DIAGNOSIS — D259 Leiomyoma of uterus, unspecified: Secondary | ICD-10-CM

## 2021-09-18 NOTE — Progress Notes (Signed)
46 y.o. G69P2002 [name] here for new patient exam.  H/o significant menorrhagia and uterine fibroids.  Is currently being treated with combination OCPs.  This has helped the heavy bleeding but she is now having some spotting or discharge every day.  She reports prior to the combination OCPs her bleeding was very heavy with passage of clots and tissue that look like liver.  For evaluation she has undergone an ultrasound that showed the uterus to measure 12.4 x 7.8 x 10.4 cm with a volume of 521 mL.  There were several fibroids in the uterus with 2 larger ones measuring 4.0 and 5.0 cm.  She has seen Dr. Aletha Halim and discussed treatment options.  She was initially leaning towards Sonata fibroid ablation procedure but she wants to talk about all of her options.  She is tired of bleeding.  She is accompanied by her husband.  We discussed today medication treatment, uterine artery embolization, myomectomy, Sonata RFA treatment, and hysterectomy.  Her only concerns about a hysterectomy is having to take hormone replacement therapy.  We discussed the difference between hysterectomy with and without ovary removal.  She thought that her ovaries needed to be removed due to the decreasing ovarian cancer risk.  She does not have any family history so I think this is unnecessary.  Lifetime risk of ovarian cancer versus cardiovascular disease benefit of keeping ovaries was discussed.  Patient has recently had a mammogram that was abnormal.  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  She is seeing Dr. Ninfa Linden, general surgery, tomorrow and will discuss treatment.  She is likely going to need a lumpectomy.  Increased screening after the lumpectomy was discussed.  I also reviewed with her that typically HRT is not used or recommended with a finding like she has.      Sexually active: Yes.    The current method of family planning is tubal ligation.    Smoking:  no  Health  Maintenance: Pap:  08/05/2021 neg with neg HR HPV MMG:  08/26/2021   reports that she has never smoked. She has never used smokeless tobacco. She reports that she does not drink alcohol and does not use drugs.  Past Medical History:  Diagnosis Date   Anemia    Fibroid     Past Surgical History:  Procedure Laterality Date   TUBAL LIGATION      Current Outpatient Medications  Medication Sig Dispense Refill   docusate sodium (COLACE) 100 MG capsule Take 1 capsule (100 mg total) by mouth 2 (two) times daily as needed. 40 capsule 1   ferrous gluconate (FERGON) 324 MG tablet Take 1 tablet (324 mg total) by mouth every other day. 30 tablet 0   norgestimate-ethinyl estradiol (ORTHO-CYCLEN) 0.25-35 MG-MCG tablet Take 1 tablet by mouth daily. 84 tablet 3   No current facility-administered medications for this visit.    Family History  Problem Relation Age of Onset   Fibroids Sister     Review of Systems  Genitourinary:  Positive for menstrual problem.   Exam:   BP 134/87 (BP Location: Right Arm, Patient Position: Sitting, Cuff Size: Normal)    Pulse 89    Ht 5\' 1"  (1.549 m) Comment: reported   Wt 121 lb (54.9 kg)    BMI 22.86 kg/m   Height: 5\' 1"  (154.9 cm) (reported)  Physical Exam Neurological:     General: No focal deficit present.     Mental Status: She is  alert.  Psychiatric:        Mood and Affect: Mood normal.        Behavior: Behavior normal.    Assessment/Plan: 1. Uterine leiomyoma, unspecified location - she is leaning towards hysterectomy.  Would like to know out of pocket cost if possible.  Risks, benefits all reviewed - will plan endometrial biopsy prior to surgery  2. Menorrhagia with regular cycle - continue OCPs for now  3.  Complex sclerosing lesion of breast - has general surgery consultation tomorrow.  Will let me know what the plan is and then we can proceed with surgical planning  Total time with pt and documentation:  33 minutes

## 2021-09-19 ENCOUNTER — Other Ambulatory Visit: Payer: Self-pay | Admitting: Surgery

## 2021-09-19 DIAGNOSIS — N6489 Other specified disorders of breast: Secondary | ICD-10-CM

## 2021-09-23 ENCOUNTER — Encounter (HOSPITAL_BASED_OUTPATIENT_CLINIC_OR_DEPARTMENT_OTHER): Payer: Self-pay

## 2021-09-23 ENCOUNTER — Other Ambulatory Visit: Payer: Self-pay | Admitting: Obstetrics and Gynecology

## 2021-09-23 ENCOUNTER — Other Ambulatory Visit: Payer: Self-pay | Admitting: Surgery

## 2021-09-23 ENCOUNTER — Encounter (HOSPITAL_BASED_OUTPATIENT_CLINIC_OR_DEPARTMENT_OTHER): Payer: Self-pay | Admitting: Obstetrics & Gynecology

## 2021-09-23 DIAGNOSIS — N6489 Other specified disorders of breast: Secondary | ICD-10-CM

## 2021-09-28 ENCOUNTER — Other Ambulatory Visit: Payer: Self-pay | Admitting: Obstetrics and Gynecology

## 2021-10-01 MED ORDER — FERROUS GLUCONATE 324 (38 FE) MG PO TABS: 324.0000 mg | ORAL_TABLET | ORAL | 2 refills | Status: DC

## 2021-10-03 ENCOUNTER — Other Ambulatory Visit (HOSPITAL_BASED_OUTPATIENT_CLINIC_OR_DEPARTMENT_OTHER): Payer: Self-pay | Admitting: Obstetrics & Gynecology

## 2021-10-03 DIAGNOSIS — N920 Excessive and frequent menstruation with regular cycle: Secondary | ICD-10-CM

## 2021-10-20 ENCOUNTER — Other Ambulatory Visit (HOSPITAL_BASED_OUTPATIENT_CLINIC_OR_DEPARTMENT_OTHER): Payer: Self-pay | Admitting: *Deleted

## 2021-10-20 ENCOUNTER — Encounter: Payer: Self-pay | Admitting: Obstetrics and Gynecology

## 2021-10-20 MED ORDER — NORGESTIMATE-ETH ESTRADIOL 0.25-35 MG-MCG PO TABS: 1.0000 | ORAL_TABLET | Freq: Every day | ORAL | 0 refills | Status: DC

## 2021-10-27 NOTE — Progress Notes (Signed)
Surgical Instructions ? ? ? Your procedure is scheduled on November 06 2021. ? Report to [facility] Main Entrance "A" at 5:30 A.M., then check in with the Admitting office. ? Call this number if you have problems the morning of surgery: ? [phone] ? ? If you have any questions prior to your surgery date call [phone]: Open Monday-Friday 8am-4pm ? ? ? Remember: ? Do not eat after midnight the night before your surgery ? ?You may drink clear liquids until 4:30am the morning of your surgery.   ?Clear liquids allowed are: Water, Non-Citrus Juices (without pulp), Carbonated Beverages, Clear Tea, Black Coffee ONLY (NO MILK, CREAM OR POWDERED CREAMER of any kind), and Gatorade ? ?Take these medicines the morning of surgery with A SIP OF WATER:  ?fluticasone (FLONASE) ?norgestimate-ethinyl estradiol (ORTHO-CYCLEN)  ? ? ?As of today, STOP taking any Aspirin (unless otherwise instructed by your surgeon) Aleve, Naproxen, Ibuprofen, Motrin, Advil, Goody's, BC's, all herbal medications, fish oil, and all vitamins. ? ?         ?Do not wear jewelry or makeup ?Do not wear lotions, powders, perfumes/colognes, or deodorant. ?Do not shave 48 hours prior to surgery.  Men may shave face and neck. ?Do not bring valuables to the hospital. ?Do not wear nail polish, gel polish, artificial nails, or any other type of covering on natural nails (fingers and toes) ?If you have artificial nails or gel coating that need to be removed by a nail salon, please have this removed prior to surgery. Artificial nails or gel coating may interfere with anesthesia's ability to adequately monitor your vital signs. ? ?[facility] is not responsible for any belongings or valuables. .  ? ?Do NOT Smoke (Tobacco/Vaping)  24 hours prior to your procedure ? ?If you use a CPAP at night, you may bring your mask for your overnight stay. ?  ?Contacts, glasses, hearing aids, dentures or partials may not be worn into surgery, please bring cases for these  belongings ?  ?For patients admitted to the hospital, discharge time will be determined by your treatment team. ?  ?Patients discharged the day of surgery will not be allowed to drive home, and someone needs to stay with them for 24 hours. ? ?NO VISITORS WILL BE ALLOWED IN PRE-OP WHERE PATIENTS ARE PREPPED FOR SURGERY.  ONLY 1 SUPPORT PERSON MAY BE PRESENT IN THE WAITING ROOM WHILE YOU ARE IN SURGERY.  IF YOU ARE TO BE ADMITTED, ONCE YOU ARE IN YOUR ROOM YOU WILL BE ALLOWED TWO (2) VISITORS. 1 (ONE) VISITOR MAY STAY OVERNIGHT BUT MUST ARRIVE TO THE ROOM BY 8pm.  Minor children may have two parents present. Special consideration for safety and communication needs will be reviewed on a case by case basis. ? ?Special instructions:   ? ?Oral Hygiene is also important to reduce your risk of infection.  Remember - BRUSH YOUR TEETH THE MORNING OF SURGERY WITH YOUR REGULAR TOOTHPASTE ? ? ?[name]- Preparing For Surgery ? ?Before surgery, you can play an important role. Because skin is not sterile, your skin needs to be as free of germs as possible. You can reduce the number of germs on your skin by washing with CHG (chlorahexidine gluconate) Soap before surgery.  CHG is an antiseptic cleaner which kills germs and bonds with the skin to continue killing germs even after washing.   ? ? ?Please do not use if you have an allergy to CHG or antibacterial soaps. If your skin becomes reddened/irritated stop using the  CHG.  ?Do not shave (including legs and underarms) for at least 48 hours prior to first CHG shower. It is OK to shave your face. ? ?Please follow these instructions carefully. ?  ? ? Shower the NIGHT BEFORE SURGERY and the MORNING OF SURGERY with CHG Soap.  ? If you chose to wash your hair, wash your hair first as usual with your normal shampoo. After you shampoo, rinse your hair and body thoroughly to remove the shampoo.  Then [employer] and genitals (private parts) with your normal soap and rinse thoroughly to  remove soap. ? ?After that Use CHG Soap as you would any other liquid soap. You can apply CHG directly to the skin and wash gently with a scrungie or a clean washcloth.  ? ?Apply the CHG Soap to your body ONLY FROM THE NECK DOWN.  Do not use on open wounds or open sores. Avoid contact with your eyes, ears, mouth and genitals (private parts). Wash Face and genitals (private parts)  with your normal soap.  ? ?Wash thoroughly, paying special attention to the area where your surgery will be performed. ? ?Thoroughly rinse your body with warm water from the neck down. ? ?DO NOT shower/wash with your normal soap after using and rinsing off the CHG Soap. ? ?Pat yourself dry with a CLEAN TOWEL. ? ?Wear CLEAN PAJAMAS to bed the night before surgery ? ?Place CLEAN SHEETS on your bed the night before your surgery ? ?DO NOT SLEEP WITH PETS. ? ? ?Day of Surgery: ? ?Take a shower with CHG soap. ?Wear Clean/Comfortable clothing the morning of surgery ?Do not apply any deodorants/lotions.   ?Remember to brush your teeth WITH YOUR REGULAR TOOTHPASTE. ? ? ? ?COVID testing ? ?If you are going to stay overnight or be admitted after your procedure/surgery and require a pre-op COVID test, please follow these instructions after your COVID test  ? ?You are not required to quarantine however you are required to wear a well-fitting mask when you are out and around people not in your household.  If your mask becomes wet or soiled, replace with a new one. ? ?Wash your hands often with soap and water for 20 seconds or clean your hands with an alcohol-based hand sanitizer that contains at least 60% alcohol. ? ?Do not share personal items. ? ?Notify your provider: ?if you are in close contact with someone who has COVID  ?or if you develop a fever of 100.4 or greater, sneezing, cough, sore throat, shortness of breath or body aches. ? ?  ?Please read over the following fact sheets that you were given.   ?

## 2021-10-28 ENCOUNTER — Encounter (HOSPITAL_COMMUNITY): Payer: Self-pay

## 2021-10-28 ENCOUNTER — Encounter (HOSPITAL_COMMUNITY)
Admission: RE | Admit: 2021-10-28 | Discharge: 2021-10-28 | Disposition: A | Discharge status: Home / Self Care | Payer: 59 | Source: Ambulatory Visit | Attending: Surgery | Admitting: Surgery

## 2021-10-28 ENCOUNTER — Other Ambulatory Visit: Payer: Self-pay

## 2021-10-28 VITALS — BP 125/79 | HR 86 | Temp 98.1°F | Resp 18 | Ht 61.0 in | Wt 121.4 lb

## 2021-10-28 DIAGNOSIS — D649 Anemia, unspecified: Secondary | ICD-10-CM | POA: Diagnosis not present

## 2021-10-28 DIAGNOSIS — D259 Leiomyoma of uterus, unspecified: Secondary | ICD-10-CM | POA: Diagnosis not present

## 2021-10-28 DIAGNOSIS — N938 Other specified abnormal uterine and vaginal bleeding: Secondary | ICD-10-CM | POA: Diagnosis not present

## 2021-10-28 DIAGNOSIS — Z01812 Encounter for preprocedural laboratory examination: Secondary | ICD-10-CM | POA: Diagnosis present

## 2021-10-28 DIAGNOSIS — N6091 Unspecified benign mammary dysplasia of right breast: Secondary | ICD-10-CM | POA: Diagnosis not present

## 2021-10-28 DIAGNOSIS — N6489 Other specified disorders of breast: Secondary | ICD-10-CM | POA: Insufficient documentation

## 2021-10-28 DIAGNOSIS — Z01818 Encounter for other preprocedural examination: Secondary | ICD-10-CM

## 2021-10-28 HISTORY — DX: Gastro-esophageal reflux disease without esophagitis: K21.9

## 2021-10-28 LAB — CBC
HCT: 29.6 % — ABNORMAL LOW (ref 36.0–46.0)
Hemoglobin: 7.9 g/dL — ABNORMAL LOW (ref 12.0–15.0)
MCH: 19.8 pg — ABNORMAL LOW (ref 26.0–34.0)
MCHC: 26.7 g/dL — ABNORMAL LOW (ref 30.0–36.0)
MCV: 74.4 fL — ABNORMAL LOW (ref 80.0–100.0)
Platelets: 578 10*3/uL — ABNORMAL HIGH (ref 150–400)
RBC: 3.98 MIL/uL (ref 3.87–5.11)
RDW: 16.2 % — ABNORMAL HIGH (ref 11.5–15.5)
WBC: 6.5 10*3/uL (ref 4.0–10.5)
nRBC: 0 % (ref 0.0–0.2)

## 2021-10-28 LAB — POCT PREGNANCY, URINE: Preg Test, Ur: NEGATIVE

## 2021-10-28 NOTE — Progress Notes (Addendum)
PCP:  Senaida Ores, MD ?Cardiologist:  denies ? ?EKG: na ?CXR:  na ?ECHO:  denies ?Stress Test:  denies ?Cardiac Cath:  denies ? ?Fasting Blood Sugar-  na ?Checks Blood Sugar__na_ times a day ? ?ASA/Blood Thinner: No ? ?OSA/CPAP: No ? ?Covid test not needed - ambulatory ? ?Anesthesia Review: Yes, seed placement 11/05/21.  Negative pregnancy test at PAT. Abnormal labs. ? ?Patient denies shortness of breath, fever, cough, and chest pain at PAT appointment. ? ?Patient verbalized understanding of instructions provided today at the PAT appointment.  Patient asked to review instructions at home and day of surgery.   ?

## 2021-10-29 NOTE — Progress Notes (Signed)
Anesthesia Chart Review: ? ? Case: [id number] Date/Time: 11/06/21 0715  ? Procedure: RIGHT BREAST LUMPECTOMY WITH RADIOACTIVE SEED LOCALIZATION (Right: Breast) - LMA  ? Anesthesia type: General  ? Pre-op diagnosis: RIGHT BREAST COMPLEX SCLEROSIS LESION, ATYPICAL LOBULAR HYPERPLASIA  ? Location: [facility] OR ROOM 09 / [place] OR  ? Surgeons: Coralie Keens, MD  ? ?  ? ? ?DISCUSSION: ?Pt is 46 years old with hx anemia, fibroid uterus ? ?Hgb 7.9, Hct 29.6.  Prior hgb 8.6 on 05/01/21. Hx heavy vaginal bleeding, fibroids; currently managed by OCPs and oral iron. She is scheduled for hysterectomy with Dr. Hale Bogus on 12/02/21 as she is "tired of bleeding." Prior note by Aletha Halim, MD 05/26/19 indicates heavy vaginal bleeding began in 2019.  ? ?Pt denied SOB at pre-admission testing. VSS at pre-admission testing ? ? ?VS: BP 125/79   Pulse 86   Temp 36.7 ?C (Oral)   Resp 18   Ht '5\' 1"'$  (1.549 m)   Wt 55.1 kg   SpO2 100%   BMI 22.94 kg/m?  ? ?PROVIDERS: ?- PCP is Aletha Halim, MD ? ? ?LABS:  ?- Hgb 7.9, Hct 29.6. Dr. Ninfa Linden has reviewed lab results.  ? ?(all labs ordered are listed, but only abnormal results are displayed) ? ?Labs Reviewed  ?CBC - Abnormal; Notable for the following components:  ?    Result Value  ? Hemoglobin 7.9 (*)   ? HCT 29.6 (*)   ? MCV 74.4 (*)   ? MCH 19.8 (*)   ? MCHC 26.7 (*)   ? RDW 16.2 (*)   ? Platelets 578 (*)   ? All other components within normal limits  ?POCT PREGNANCY, URINE  ? ? ?EKG: N/A ? ? ?CV: N/A ? ?Past Medical History:  ?Diagnosis Date  ? Anemia   ? Fibroid   ? GERD (gastroesophageal reflux disease)   ? ? ?Past Surgical History:  ?Procedure Laterality Date  ? TUBAL LIGATION    ? ? ?MEDICATIONS: ? docusate sodium (COLACE) 100 MG capsule  ? ferrous gluconate (FERGON) 324 MG tablet  ? fluticasone (FLONASE) 50 MCG/ACT nasal spray  ? norgestimate-ethinyl estradiol (ORTHO-CYCLEN) 0.25-35 MG-MCG tablet  ? ?No current facility-administered medications for this encounter.  ? ? ?Pt with  known hx anemia, current heavy vaginal bleeding managed by GYN. Is scheduled for hysterectomy in April to prioritize breast lumpectomy first. Does not appear to be symptomatic with anemia based on no reported symptoms at PAT and stable VS at PAT. Dr. Ninfa Linden is aware of CBC results from PAT ? ?If no changes, I anticipate pt can proceed with surgery as scheduled.  ? ?Willeen Cass, PhD, FNP-BC ?[facility]/Anesthesiology ?Phone: [phone] ?10/29/2021 1:57 PM ? ? ? ? ? ? ?

## 2021-10-29 NOTE — Anesthesia Preprocedure Evaluation (Addendum)
Anesthesia Evaluation  ?Patient identified by MRN, date of birth, ID band ?Patient awake ? ? ? ?Reviewed: ?Allergy & Precautions, NPO status , Patient's Chart, lab work & pertinent test results ? ?Airway ?Mallampati: II ? ?TM Distance: >3 FB ?Neck ROM: Full ? ? ? Dental ? ?(+) Dental Advisory Given, Teeth Intact ?  ?Pulmonary ?neg pulmonary ROS,  ?  ?Pulmonary exam normal ?breath sounds clear to auscultation ? ? ? ? ? ? Cardiovascular ?negative cardio ROS ?Normal cardiovascular exam ?Rhythm:Regular Rate:Normal ? ? ?  ?Neuro/Psych ?negative neurological ROS ?   ? GI/Hepatic ?Neg liver ROS, GERD  ,  ?Endo/Other  ?negative endocrine ROS ? Renal/GU ?negative Renal ROS  ? ?  ?Musculoskeletal ?negative musculoskeletal ROS ?(+)  ? Abdominal ?  ?Peds ? Hematology ? ?(+) Blood dyscrasia, anemia ,   ?Anesthesia Other Findings ? ? Reproductive/Obstetrics ? ?  ? ? ? ? ? ? ? ? ? ? ? ? ? ?  ?  ? ? ? ? ? ? ? ?Anesthesia Physical ?Anesthesia Plan ? ?ASA: 3 ? ?Anesthesia Plan: General  ? ?Post-op Pain Management: Tylenol PO (pre-op)* and Celebrex PO (pre-op)*  ? ?Induction: Intravenous ? ?PONV Risk Score and Plan: 4 or greater and Ondansetron, Dexamethasone, Treatment may vary due to age or medical condition and Midazolam ? ?Airway Management Planned: Oral ETT ? ?Additional Equipment:  ? ?Intra-op Plan:  ? ?Post-operative Plan: Extubation in OR ? ?Informed Consent: I have reviewed the patients History and Physical, chart, labs and discussed the procedure including the risks, benefits and alternatives for the proposed anesthesia with the patient or authorized representative who has indicated his/her understanding and acceptance.  ? ? ? ?Dental advisory given ? ?Plan Discussed with: CRNA ? ?Anesthesia Plan Comments: (See APP note by Durel Salts, FNP )  ? ? ? ? ?Anesthesia Quick Evaluation ? ?

## 2021-11-05 ENCOUNTER — Other Ambulatory Visit: Payer: Self-pay

## 2021-11-05 ENCOUNTER — Ambulatory Visit
Admission: RE | Admit: 2021-11-05 | Discharge: 2021-11-05 | Disposition: A | Discharge status: Home / Self Care | Payer: No Typology Code available for payment source | Source: Ambulatory Visit | Attending: Surgery | Admitting: Surgery

## 2021-11-05 DIAGNOSIS — N6489 Other specified disorders of breast: Secondary | ICD-10-CM

## 2021-11-05 NOTE — H&P (Signed)
? ?REFERRING PHYSICIAN: Constant, Peggy, MD ? ?PROVIDER: Beverlee Nims, MD ? ?MRN: [id number] ?DOB: 1976-07-23 ? ?Subjective  ? ?Chief Complaint: New Consultation (R complex scelorising lesion) ? ? ?History of Present Illness: ?Frances Craig is a 46 y.o. female who is seen as an office consultation at the request of Dr. Elly Modena for evaluation of New Consultation (R complex scelorising lesion) ?.  ? ?This is a pleasant 46 year old female who was referred here for evaluation of recent abnormal findings on mammogram and biopsy. She had gone for screening routine films after her primary provider felt significant fullness in the upper outer quadrants of her breast. This showed a 2.4 x 3.7 cm area of spiculation in the upper outer quadrant of the right breast. She underwent a biopsy of this showing a complex sclerosing lesion with calcifications and atypical lobular hyperplasia. No malignancy was identified. She has had no previous problems regarding her breast. She denies nipple discharge. There is no family history of breast cancer. ?She has been having bleeding fibroids and is being considered for a hysterectomy ? ?Review of Systems: ?A complete review of systems was obtained from the patient. I have reviewed this information and discussed as appropriate with the patient. See HPI as well for other ROS. ? ?ROS  ? ?Medical History: ?Past Medical History:  ?Diagnosis Date  ? Anemia  ? ?There is no problem list on file for this patient. ? ?Past Surgical History:  ?Procedure Laterality Date  ? HYSTERECTOMY  ? LAPAROSCOPIC TUBAL LIGATION  ? ? ?No Known Allergies ? ?Current Outpatient Medications on File Prior to Visit  ?Medication Sig Dispense Refill  ? ferrous gluconate (FERGON) 324 MG tablet Take 1 tablet by mouth every other day  ? SPRINTEC, 28, 0.25-35 mg-mcg tablet Take 1 tablet by mouth once daily  ? ?No current facility-administered medications on file prior to visit.  ? ?Family History  ?Problem Relation  Age of Onset  ? Obesity Mother  ? High blood pressure (Hypertension) Mother  ? Hyperlipidemia (Elevated cholesterol) Mother  ? Coronary Artery Disease (Blocked arteries around heart) Mother  ? Diabetes Mother  ? ? ?Social History  ? ?Tobacco Use  ?Smoking Status Never  ?Smokeless Tobacco Never  ? ? ?Social History  ? ?Socioeconomic History  ? Marital status: Married  ?Tobacco Use  ? Smoking status: Never  ? Smokeless tobacco: Never  ?Vaping Use  ? Vaping Use: Never used  ?Substance and Sexual Activity  ? Alcohol use: Not Currently  ? Drug use: Never  ? ?Objective:  ? ?Vitals:  ? ?BP: (!) 140/82  ?Pulse: 92  ?SpO2: 92%  ?Weight: 54.6 kg (120 lb 6.4 oz)  ?Height: 154.9 cm ('5\' 1"'$ )  ? ?Body mass index is 22.75 kg/m?. ? ?Physical Exam  ? ?She appears well on exam ? ?Breast exam bilaterally. There is fullness and dense tissue in the upper outer quadrants of both breast bilaterally. I cannot feel a specific mass. The nipple areolar complex is normal. There is no axillary adenopathy. ? ?Labs, Imaging and Diagnostic Testing: ?I have reviewed her mammograms, ultrasound, and pathology results ? ?Assessment and Plan:  ? ?Diagnoses and all orders for this visit: ? ?Complex sclerosing lesion of right breast ? ?Atypical lobular hyperplasia right breast ? ?I had a discussion with the patient and her husband regarding the mammograms and findings on pathology. Given her findings, removal of this area is recommended for complete histologic evaluation to rule out invasive cancer. ?I discussed proceeding with a radioactive seed  guided right breast lumpectomy. I explained the procedure in detail. We discussed the risks which includes but is not limited to bleeding, infection, injury to surrounding structures, the need for further procedures if malignancy is found, cardiopulmonary issues, postoperative recovery, etc. They understand and agree to proceed with surgery which will be scheduled.`  ?

## 2021-11-06 ENCOUNTER — Encounter (HOSPITAL_COMMUNITY)
Admission: RE | Disposition: A | Discharge status: Home / Self Care | Payer: Self-pay | Source: Home / Self Care | Attending: Surgery

## 2021-11-06 ENCOUNTER — Ambulatory Visit (HOSPITAL_COMMUNITY): Payer: 59 | Admitting: Emergency Medicine

## 2021-11-06 ENCOUNTER — Ambulatory Visit (HOSPITAL_BASED_OUTPATIENT_CLINIC_OR_DEPARTMENT_OTHER): Payer: 59 | Admitting: Anesthesiology

## 2021-11-06 ENCOUNTER — Ambulatory Visit
Admission: RE | Admit: 2021-11-06 | Discharge: 2021-11-06 | Disposition: A | Discharge status: Home / Self Care | Payer: No Typology Code available for payment source | Source: Ambulatory Visit | Attending: Surgery | Admitting: Surgery

## 2021-11-06 ENCOUNTER — Other Ambulatory Visit: Payer: Self-pay

## 2021-11-06 ENCOUNTER — Encounter (HOSPITAL_COMMUNITY): Payer: Self-pay | Admitting: Surgery

## 2021-11-06 ENCOUNTER — Ambulatory Visit (HOSPITAL_COMMUNITY)
Admission: RE | Admit: 2021-11-06 | Discharge: 2021-11-06 | Disposition: A | Discharge status: Home / Self Care | Payer: 59 | Attending: Surgery | Admitting: Surgery

## 2021-11-06 DIAGNOSIS — N6091 Unspecified benign mammary dysplasia of right breast: Secondary | ICD-10-CM

## 2021-11-06 DIAGNOSIS — N6012 Diffuse cystic mastopathy of left breast: Secondary | ICD-10-CM | POA: Insufficient documentation

## 2021-11-06 DIAGNOSIS — D759 Disease of blood and blood-forming organs, unspecified: Secondary | ICD-10-CM | POA: Insufficient documentation

## 2021-11-06 DIAGNOSIS — D0581 Other specified type of carcinoma in situ of right breast: Secondary | ICD-10-CM | POA: Diagnosis present

## 2021-11-06 DIAGNOSIS — N6011 Diffuse cystic mastopathy of right breast: Secondary | ICD-10-CM | POA: Insufficient documentation

## 2021-11-06 DIAGNOSIS — D649 Anemia, unspecified: Secondary | ICD-10-CM | POA: Diagnosis not present

## 2021-11-06 DIAGNOSIS — D259 Leiomyoma of uterus, unspecified: Secondary | ICD-10-CM | POA: Insufficient documentation

## 2021-11-06 DIAGNOSIS — N6489 Other specified disorders of breast: Secondary | ICD-10-CM | POA: Diagnosis not present

## 2021-11-06 DIAGNOSIS — C801 Malignant (primary) neoplasm, unspecified: Secondary | ICD-10-CM

## 2021-11-06 HISTORY — PX: BREAST LUMPECTOMY WITH RADIOACTIVE SEED LOCALIZATION: SHX6424

## 2021-11-06 HISTORY — DX: Malignant (primary) neoplasm, unspecified: C80.1

## 2021-11-06 SURGERY — BREAST LUMPECTOMY WITH RADIOACTIVE SEED LOCALIZATION
Anesthesia: General | Site: Breast | Laterality: Right

## 2021-11-06 MED ORDER — LIDOCAINE 2% (20 MG/ML) 5 ML SYRINGE
INTRAMUSCULAR | Status: AC
  Filled 2021-11-06: qty 5

## 2021-11-06 MED ORDER — DEXAMETHASONE SODIUM PHOSPHATE 10 MG/ML IJ SOLN
INTRAMUSCULAR | Status: AC
  Filled 2021-11-06: qty 1

## 2021-11-06 MED ORDER — PROPOFOL 10 MG/ML IV BOLUS
INTRAVENOUS | Status: DC | PRN
  Administered 2021-11-06: 140 mg via INTRAVENOUS

## 2021-11-06 MED ORDER — MIDAZOLAM HCL 2 MG/2ML IJ SOLN
INTRAMUSCULAR | Status: AC
  Filled 2021-11-06: qty 2

## 2021-11-06 MED ORDER — CHLORHEXIDINE GLUCONATE 0.12 % MT SOLN
OROMUCOSAL | Status: AC
  Administered 2021-11-06: 15 mL
  Filled 2021-11-06: qty 15

## 2021-11-06 MED ORDER — PROPOFOL 10 MG/ML IV BOLUS
INTRAVENOUS | Status: AC
  Filled 2021-11-06: qty 20

## 2021-11-06 MED ORDER — FENTANYL CITRATE (PF) 250 MCG/5ML IJ SOLN
INTRAMUSCULAR | Status: AC
  Filled 2021-11-06: qty 5

## 2021-11-06 MED ORDER — BUPIVACAINE-EPINEPHRINE 0.25% -1:200000 IJ SOLN
INTRAMUSCULAR | Status: DC | PRN
  Administered 2021-11-06: 25 mL

## 2021-11-06 MED ORDER — MEPERIDINE HCL 25 MG/ML IJ SOLN: 6.2500 mg | INTRAMUSCULAR | Status: DC | PRN

## 2021-11-06 MED ORDER — CELECOXIB 200 MG PO CAPS
ORAL_CAPSULE | ORAL | Status: AC
  Administered 2021-11-06: 200 mg via ORAL
  Filled 2021-11-06: qty 1

## 2021-11-06 MED ORDER — CELECOXIB 200 MG PO CAPS: 200.0000 mg | ORAL_CAPSULE | Freq: Once | ORAL | Status: AC

## 2021-11-06 MED ORDER — 0.9 % SODIUM CHLORIDE (POUR BTL) OPTIME
TOPICAL | Status: DC | PRN
  Administered 2021-11-06: 1000 mL

## 2021-11-06 MED ORDER — MIDAZOLAM HCL 5 MG/5ML IJ SOLN
INTRAMUSCULAR | Status: DC | PRN
  Administered 2021-11-06: 2 mg via INTRAVENOUS

## 2021-11-06 MED ORDER — AMISULPRIDE (ANTIEMETIC) 5 MG/2ML IV SOLN: 10.0000 mg | Freq: Once | INTRAVENOUS | Status: DC | PRN

## 2021-11-06 MED ORDER — ACETAMINOPHEN 500 MG PO TABS: 1000.0000 mg | ORAL_TABLET | ORAL | Status: AC

## 2021-11-06 MED ORDER — CEFAZOLIN SODIUM-DEXTROSE 2-4 GM/100ML-% IV SOLN
INTRAVENOUS | Status: AC
  Filled 2021-11-06: qty 100

## 2021-11-06 MED ORDER — CHLORHEXIDINE GLUCONATE CLOTH 2 % EX PADS: 6.0000 | MEDICATED_PAD | Freq: Once | CUTANEOUS | Status: DC

## 2021-11-06 MED ORDER — ONDANSETRON HCL 4 MG/2ML IJ SOLN
INTRAMUSCULAR | Status: DC | PRN
  Administered 2021-11-06: 4 mg via INTRAVENOUS

## 2021-11-06 MED ORDER — DEXAMETHASONE SODIUM PHOSPHATE 4 MG/ML IJ SOLN
INTRAMUSCULAR | Status: DC | PRN
  Administered 2021-11-06: 6 mg via INTRAVENOUS

## 2021-11-06 MED ORDER — LACTATED RINGERS IV SOLN: INTRAVENOUS | Status: DC | PRN

## 2021-11-06 MED ORDER — BUPIVACAINE-EPINEPHRINE (PF) 0.25% -1:200000 IJ SOLN
INTRAMUSCULAR | Status: AC
  Filled 2021-11-06: qty 30

## 2021-11-06 MED ORDER — LIDOCAINE HCL (CARDIAC) PF 100 MG/5ML IV SOSY
PREFILLED_SYRINGE | INTRAVENOUS | Status: DC | PRN
  Administered 2021-11-06: 50 mg via INTRATRACHEAL

## 2021-11-06 MED ORDER — ACETAMINOPHEN 500 MG PO TABS
ORAL_TABLET | ORAL | Status: AC
  Administered 2021-11-06: 1000 mg via ORAL
  Filled 2021-11-06: qty 2

## 2021-11-06 MED ORDER — ONDANSETRON HCL 4 MG/2ML IJ SOLN
INTRAMUSCULAR | Status: AC
  Filled 2021-11-06: qty 2

## 2021-11-06 MED ORDER — FENTANYL CITRATE (PF) 100 MCG/2ML IJ SOLN
INTRAMUSCULAR | Status: DC | PRN
  Administered 2021-11-06 (×3): 50 ug via INTRAVENOUS

## 2021-11-06 MED ORDER — CEFAZOLIN SODIUM-DEXTROSE 2-4 GM/100ML-% IV SOLN
2.0000 g | INTRAVENOUS | Status: AC
  Administered 2021-11-06: 2 g via INTRAVENOUS

## 2021-11-06 MED ORDER — HYDROMORPHONE HCL 1 MG/ML IJ SOLN: 0.2500 mg | INTRAMUSCULAR | Status: DC | PRN

## 2021-11-06 MED ORDER — TRAMADOL HCL 50 MG PO TABS: 50.0000 mg | ORAL_TABLET | Freq: Four times a day (QID) | ORAL | 0 refills | Status: DC | PRN

## 2021-11-06 SURGICAL SUPPLY — 37 items
ADH SKN CLS APL DERMABOND .7 (GAUZE/BANDAGES/DRESSINGS) ×1
APL PRP STRL LF DISP 70% ISPRP (MISCELLANEOUS) ×1
APPLIER CLIP 9.375 MED OPEN (MISCELLANEOUS) ×2
APR CLP MED 9.3 20 MLT OPN (MISCELLANEOUS) ×1
BAG COUNTER SPONGE SURGICOUNT (BAG) ×3 IMPLANT
BAG SPNG CNTER NS LX DISP (BAG) ×1
BINDER BREAST LRG (GAUZE/BANDAGES/DRESSINGS) IMPLANT
BINDER BREAST XLRG (GAUZE/BANDAGES/DRESSINGS) IMPLANT
CANISTER SUCT 3000ML PPV (MISCELLANEOUS) ×3 IMPLANT
CHLORAPREP W/TINT 26 (MISCELLANEOUS) ×3 IMPLANT
CLIP APPLIE 9.375 MED OPEN (MISCELLANEOUS) ×2 IMPLANT
COVER PROBE W GEL 5X96 (DRAPES) ×3 IMPLANT
COVER SURGICAL LIGHT HANDLE (MISCELLANEOUS) ×3 IMPLANT
DERMABOND ADVANCED (GAUZE/BANDAGES/DRESSINGS) ×1
DERMABOND ADVANCED .7 DNX12 (GAUZE/BANDAGES/DRESSINGS) ×2 IMPLANT
DEVICE DUBIN SPECIMEN MAMMOGRA (MISCELLANEOUS) ×3 IMPLANT
DRAPE CHEST BREAST 15X10 FENES (DRAPES) ×3 IMPLANT
ELECT CAUTERY BLADE 6.4 (BLADE) ×3 IMPLANT
ELECT REM PT RETURN 9FT ADLT (ELECTROSURGICAL) ×2
ELECTRODE REM PT RTRN 9FT ADLT (ELECTROSURGICAL) ×2 IMPLANT
GLOVE SURG SIGNA 7.5 PF LTX (GLOVE) ×3 IMPLANT
GOWN STRL REUS W/ TWL LRG LVL3 (GOWN DISPOSABLE) ×2 IMPLANT
GOWN STRL REUS W/ TWL XL LVL3 (GOWN DISPOSABLE) ×2 IMPLANT
GOWN STRL REUS W/TWL LRG LVL3 (GOWN DISPOSABLE) ×2
GOWN STRL REUS W/TWL XL LVL3 (GOWN DISPOSABLE) ×2
KIT BASIN OR (CUSTOM PROCEDURE TRAY) ×3 IMPLANT
KIT MARKER MARGIN INK (KITS) ×3 IMPLANT
NDL HYPO 25GX1X1/2 BEV (NEEDLE) ×2 IMPLANT
NEEDLE HYPO 25GX1X1/2 BEV (NEEDLE) ×2 IMPLANT
NS IRRIG 1000ML POUR BTL (IV SOLUTION) ×1 IMPLANT
PACK GENERAL/GYN (CUSTOM PROCEDURE TRAY) ×3 IMPLANT
STRIP CLOSURE SKIN 1/2X4 (GAUZE/BANDAGES/DRESSINGS) ×1 IMPLANT
SUT MNCRL AB 4-0 PS2 18 (SUTURE) ×3 IMPLANT
SUT VIC AB 3-0 SH 18 (SUTURE) ×3 IMPLANT
SYR CONTROL 10ML LL (SYRINGE) ×3 IMPLANT
TOWEL GREEN STERILE (TOWEL DISPOSABLE) ×3 IMPLANT
TOWEL GREEN STERILE FF (TOWEL DISPOSABLE) ×3 IMPLANT

## 2021-11-06 NOTE — Transfer of Care (Signed)
Immediate Anesthesia Transfer of Care Note ? ?Patient: Frances Craig ? ?Procedure(s) Performed: RIGHT BREAST LUMPECTOMY WITH RADIOACTIVE SEED LOCALIZATION (Right: Breast) ? ?Patient Location: PACU ? ?Anesthesia Type:General ? ?Level of Consciousness: drowsy ? ?Airway & Oxygen Therapy: Patient Spontanous Breathing and Patient connected to face mask oxygen ? ?Post-op Assessment: Report given to RN and Post -op Vital signs reviewed and stable ? ?Post vital signs: Reviewed and stable ? ?Last Vitals:  ?Vitals Value Taken Time  ?BP 106/73 11/06/21 0830  ?Temp    ?Pulse 79 11/06/21 0830  ?Resp 14 11/06/21 0830  ?SpO2 100 % 11/06/21 0830  ?Vitals shown include unvalidated device data. ? ?Last Pain:  ?Vitals:  ? 11/06/21 0615  ?TempSrc:   ?PainSc: 0-No pain  ?   ? ?  ? ?Complications: No notable events documented. ?

## 2021-11-06 NOTE — Anesthesia Postprocedure Evaluation (Signed)
Anesthesia Post Note ? ?Patient: [name] ? ?Procedure(s) Performed: RIGHT BREAST LUMPECTOMY WITH RADIOACTIVE SEED LOCALIZATION (Right: Breast) ? ?  ? ?Patient location during evaluation: PACU ?Anesthesia Type: General ?Level of consciousness: sedated and patient cooperative ?Pain management: pain level controlled ?Vital Signs Assessment: post-procedure vital signs reviewed and stable ?Respiratory status: spontaneous breathing ?Cardiovascular status: stable ?Anesthetic complications: no ? ? ?No notable events documented. ? ?Last Vitals:  ?Vitals:  ? 11/06/21 0845 11/06/21 0900  ?BP: 112/81 118/78  ?Pulse: 78 77  ?Resp: 15 18  ?Temp:  36.9 ?C  ?SpO2: 100% 100%  ?  ?Last Pain:  ?Vitals:  ? 11/06/21 0900  ?TempSrc:   ?PainSc: 0-No pain  ? ? ?  ?  ?  ?  ?  ?  ? ?Nolon Nations ? ? ? ? ?

## 2021-11-06 NOTE — Op Note (Signed)
RIGHT BREAST LUMPECTOMY WITH RADIOACTIVE SEED LOCALIZATION  Procedure Note ? ?Bobbe Medico Kader ?11/06/2021 ? ? ?Pre-op Diagnosis: RIGHT BREAST COMPLEX SCLEROSIS LESION, ATYPICAL LOBULAR HYPERPLASIA ?    ?Post-op Diagnosis: same ? ?Procedure(s): ?RIGHT BREAST LUMPECTOMY WITH RADIOACTIVE SEED LOCALIZATION ? ?Surgeon(s): ?Coralie Keens, MD ? ?Assistant: Vernell Leep, MD [facility] Resident ? ?Anesthesia: General ? ?Staff:  ?Circulator: Margy Clarks, RN; Celene Squibb, RN ?Scrub Person: Serita Grammes ? ?Estimated Blood Loss: Minimal ?              ?Specimens: sent to path ? ?Indications: This is a 46 year old female was found to have a abnormality in the right breast on screening mammography.  This was in the deep upper outer quadrant of the breast.  She underwent a biopsy showing a complex grossing lesion and atypical lobular hyperplasia.  The area measured more than 3 cm in size.  The decision was made to proceed with a radioactive seed guided lumpectomy ? ?Procedure: The patient is brought to operating identifies correct patient.  She is placed upon the operating table and general anesthesia was induced.  Her right breast was prepped and draped in usual sterile fashion.  Using the neoprobe the seed was located in the upper outer quadrant several centimeters from the nipple areolar complex.  I anesthetized the skin over the lateral edge of the areola with Marcaine.  We then made incision down to the breast tissue with the cautery.  We then dissected laterally with the aid of neoprobe toward the radioactive seed which was in the deep breast tissue near the chest wall.  Once we got lateral past the seed with the neoprobe we then dissected down to the chest wall I performed a wide lumpectomy staying around the seed.  Once the specimen was removed an x-ray was performed confirmed the seed was in the specimen.  The original biopsy clip was not both felt to be superior.  We took more tissue in this area in 3  separate slices and found the original clip as well.  All of the tissue was very dense and fibrocystic.  There was no discrete mass.  We achieved hemostasis with cautery.  We anesthetized the incision further with Marcaine.  The subcutaneous tissue was then closed with interrupted 3-0 Vicryl sutures and skin was closed with running 4-0 Monocryl.  Dermabond and a breast binder were then applied.  The patient tolerated the procedure well.  All counts were correct at the end of the procedure.  The patient was then extubated in the operating room and taken in stable condition to the recovery room.  All specimens were sent to pathology for evaluation ?        ? ?Coralie Keens  ? ?Date: 11/06/2021  Time: 8:14 AM ? ? ? ?

## 2021-11-06 NOTE — Anesthesia Procedure Notes (Signed)
Procedure Name: LMA Insertion ?Date/Time: 11/06/2021 7:35 AM ?Performed by: Ezequiel Kayser, CRNA ?Pre-anesthesia Checklist: Patient identified, Emergency Drugs available, Suction available and Patient being monitored ?Patient Re-evaluated:Patient Re-evaluated prior to induction ?Oxygen Delivery Method: Circle System Utilized ?Preoxygenation: Pre-oxygenation with 100% oxygen ?Induction Type: IV induction ?Ventilation: Mask ventilation without difficulty ?LMA: LMA inserted ?LMA Size: 3.0 ?Number of attempts: 1 ?Airway Equipment and Method: Bite block ?Placement Confirmation: positive ETCO2 ?Tube secured with: Tape ?Dental Injury: Teeth and Oropharynx as per pre-operative assessment  ? ? ? ? ?

## 2021-11-06 NOTE — Discharge Instructions (Signed)
[facility] °Office Phone Number [phone] ° °BREAST BIOPSY/ PARTIAL MASTECTOMY: POST OP INSTRUCTIONS ° °Always review your discharge instruction sheet given to you by the facility where your surgery was performed. ° °IF YOU HAVE DISABILITY OR FAMILY LEAVE FORMS, YOU MUST BRING THEM TO THE OFFICE FOR PROCESSING.  DO NOT GIVE THEM TO YOUR DOCTOR. ° °A prescription for pain medication may be given to you upon discharge.  Take your pain medication as prescribed, if needed.  If narcotic pain medicine is not needed, then you may take acetaminophen (Tylenol) or ibuprofen (Advil) as needed. °Take your usually prescribed medications unless otherwise directed °If you need a refill on your pain medication, please contact your pharmacy.  They will contact our office to request authorization.  Prescriptions will not be filled after 5pm or on week-ends. °You should eat very light the first 24 hours after surgery, such as soup, crackers, pudding, etc.  Resume your normal diet the day after surgery. °Most patients will experience some swelling and bruising in the breast.  Ice packs and a good support bra will help.  Swelling and bruising can take several days to resolve.  °It is common to experience some constipation if taking pain medication after surgery.  Increasing fluid intake and taking a stool softener will usually help or prevent this problem from occurring.  A mild laxative (Milk of Magnesia or Miralax) should be taken according to package directions if there are no bowel movements after 48 hours. °Unless discharge instructions indicate otherwise, you may remove your bandages 24-48 hours after surgery, and you may shower at that time.  You may have steri-strips (small skin tapes) in place directly over the incision.  These strips should be left on the skin for 7-10 days.  If your surgeon used skin glue on the incision, you may shower in 24 hours.  The glue will flake off over the next 2-3 weeks.  Any  sutures or staples will be removed at the office during your follow-up visit. °ACTIVITIES:  You may resume regular daily activities (gradually increasing) beginning the next day.  Wearing a good support bra or sports bra minimizes pain and swelling.  You may have sexual intercourse when it is comfortable. °You may drive when you no longer are taking prescription pain medication, you can comfortably wear a seatbelt, and you can safely maneuver your car and apply brakes. °RETURN TO WORK:  ______________________________________________________________________________________ °You should see your doctor in the office for a follow-up appointment approximately two weeks after your surgery.  Your doctor’s nurse will typically make your follow-up appointment when she calls you with your pathology report.  Expect your pathology report 2-3 business days after your surgery.  You may call to check if you do not hear from us after three days. °OTHER INSTRUCTIONS: OK TO REMOVE THE BINDER AND SHOWER STARTING TOMORROW °ICE PACK, TYLENOL, AND IBUPROFEN ALSO FOR PAIN °NO VIGOROUS ACTIVITY FOR ONE WEEK °_______________________________________________________________________________________________ _____________________________________________________________________________________________________________________________________ °_____________________________________________________________________________________________________________________________________ °_____________________________________________________________________________________________________________________________________ ° °WHEN TO CALL YOUR DOCTOR: °Fever over 101.0 °Nausea and/or vomiting. °Extreme swelling or bruising. °Continued bleeding from incision. °Increased pain, redness, or drainage from the incision. ° °The clinic staff is available to answer your questions during regular business hours.  Please don’t hesitate to call and ask to speak to one of the  nurses for clinical concerns.  If you have a medical emergency, go to the nearest emergency room or call 911.  A surgeon from [facility] is always on call at the hospital. ° °For   further questions, please visit [email or web address]   °

## 2021-11-06 NOTE — Interval H&P Note (Signed)
History and Physical Interval Note:no change in H and P ? ?11/06/2021 ?7:06 AM ? ?[name]  has presented today for surgery, with the diagnosis of RIGHT BREAST COMPLEX SCLEROSIS LESION, ATYPICAL LOBULAR HYPERPLASIA.  The various methods of treatment have been discussed with the patient and family. After consideration of risks, benefits and other options for treatment, the patient has consented to  Procedure(s) with comments: ?RIGHT BREAST LUMPECTOMY WITH RADIOACTIVE SEED LOCALIZATION (Right) - LMA as a surgical intervention.  The patient's history has been reviewed, patient examined, no change in status, stable for surgery.  I have reviewed the patient's chart and labs.  Questions were answered to the patient's satisfaction.   ? ? ?Frances Craig ? ? ?

## 2021-11-07 ENCOUNTER — Encounter (HOSPITAL_COMMUNITY): Payer: Self-pay | Admitting: Surgery

## 2021-11-13 ENCOUNTER — Other Ambulatory Visit: Payer: Self-pay

## 2021-11-13 ENCOUNTER — Encounter (HOSPITAL_BASED_OUTPATIENT_CLINIC_OR_DEPARTMENT_OTHER): Payer: Self-pay | Admitting: Obstetrics & Gynecology

## 2021-11-13 NOTE — Progress Notes (Signed)
?Your procedure is scheduled on Tuesday, 12/02/21. ? Report to [facility] ? Call this number if you have problems the morning of surgery  :[phone]. ? ? [facility].  WE ARE LOCATED IN [facility]. ? ?PLEASE BRING YOUR INSURANCE CARD AND PHOTO ID DAY OF SURGERY. ? ?ONLY 2 PEOPLE ARE ALLOWED IN  WAITING  ROOM.  ?                                   ? REMEMBER: ? DO NOT EAT FOOD, CANDY GUM OR MINTS  AFTER MIDNIGHT THE NIGHT BEFORE YOUR SURGERY . YOU MAY HAVE CLEAR LIQUIDS FROM MIDNIGHT THE NIGHT BEFORE YOUR SURGERY UNTIL  4:30 am. NO CLEAR LIQUIDS AFTER   4:30 am DAY OF SURGERY. ? ?YOU MAY  BRUSH YOUR TEETH MORNING OF SURGERY AND RINSE YOUR MOUTH OUT, NO CHEWING GUM CANDY OR MINTS. ? ? ? ? ?CLEAR LIQUID DIET ? ? ?Foods Allowed                                                                     Foods Excluded ? ?Coffee and tea, regular and decaf                             liquids that you cannot  ?Plain Jell-O any favor except red or purple                                           see through such as: ?Fruit ices (not with fruit pulp)                                     milk, soups, orange juice  ?Iced Popsicles                                    All solid food ?Carbonated beverages, regular and diet                                    ?Cranberry, grape and apple juices ?Sports drinks like Gatorade ? ?Sample Menu ?Breakfast                                Lunch                                     Supper ?Cranberry juice                                           ?  Jell-O                                     Grape juice                           Apple juice ?Coffee or tea                        Jell-O                                      Popsicle ?                                               Coffee or tea                        Coffee or tea ? ?_____________________________________________________________________ ?  ? ? TAKE THESE MEDICATIONS  MORNING OF SURGERY WITH A SIP OF WATER:  Flonase ? ?TWO VISITORS ARE  ALLOWED IN WAITING ROOM ONLY DAY OF SURGERY.   ? ? UP TO 4 VISITORS  MAY VISIT IN THE EXTENDED RECOVERY ROOM UNTIL 800 PM ONLY.  1 VISITOR AGE 39 AND OVER MAY SPEND THE NIGHT AND MUST BE IN EXTENDED RECOVERY ROOM NO LATER THAN 800 PM . YOUR DISCHARGE TIME AFTER YOU SPEND THE NIGHT IS 900 AM THE MORNING AFTER YOUR SURGERY. ? ?YOU MAY PACK A SMALL OVERNIGHT BAG WITH TOILETRIES FOR YOUR OVERNIGHT STAY IF YOU WISH. ? ?YOUR PRESCRIPTION MEDICATIONS WILL BE PROVIDED DURING [place]. ?                                   ?DO NOT WEAR JEWERLY, MAKE UP. ?DO NOT WEAR LOTIONS, POWDERS, PERFUMES OR NAIL POLISH ON YOUR FINGERNAILS. TOENAIL POLISH IS OK TO WEAR. ?DO NOT SHAVE FOR 48 HOURS PRIOR TO DAY OF SURGERY. ?MEN MAY SHAVE FACE AND NECK. ?CONTACTS, GLASSES, OR DENTURES MAY NOT BE WORN TO SURGERY. ? ?REMEMBER: NO SMOKING, DRUGS OR ALCOHOL FOR 24 HOURS BEFORE YOUR SURGERY. ?                                   ?[facility] IS NOT RESPONSIBLE  FOR ANY BELONGINGS.                                  ?                                  . ?          [facility] - Preparing for Surgery ?Before surgery, you can play an important role.  Because skin is not sterile, your skin needs to be as free of germs as possible.  You can reduce the number of germs on your skin by washing with CHG (chlorahexidine gluconate) soap before  surgery.  CHG is an antiseptic cleaner which kills germs and bonds with the skin to continue killing germs even after washing. ?Please DO NOT use if you have an allergy to CHG or antibacterial soaps.  If your skin becomes reddened/irritated stop using the CHG and inform your nurse when you arrive at Short Stay. ?Do not shave (including legs and underarms) for at least 48 hours prior to the first CHG shower.  You may shave your face/neck. ?Please follow these instructions carefully: ? 1.  Shower with CHG Soap the night before surgery and the   morning of Surgery. ? 2.  If you choose to wash your hair, wash your hair first as usual with your  normal  shampoo. ? 3.  After you shampoo, rinse your hair and body thoroughly to remove the  shampoo.                            ?4.  Use CHG as you would any other liquid soap.  You can apply chg directly  to the skin and wash  ?                    Gently with a scrungie or clean washcloth. ? 5.  Apply the CHG Soap to your body ONLY FROM THE NECK DOWN.   Do not use on face/ open      ?                     Wound or open sores. Avoid contact with eyes, ears mouth and genitals (private parts).  ?                     Production manager,  Genitals (private parts) with your normal soap. ?            6.  Wash thoroughly, paying special attention to the area where your surgery  will be performed. ? 7.  Thoroughly rinse your body with warm water from the neck down. ? 8.  DO NOT shower/wash with your normal soap after using and rinsing off  the CHG Soap. ?               9.  Pat yourself dry with a clean towel. ?           10.  Wear clean pajamas. ?           11.  Place clean sheets on your bed the night of your first shower and do not  sleep with pets. ?Day of Surgery : ?Do not apply any lotions/deodorants the morning of surgery.  Please wear clean clothes to the hospital/surgery center. ? ?IF YOU HAVE ANY SKIN IRRITATION OR PROBLEMS WITH THE SURGICAL SOAP, PLEASE GET A BAR OF GOLD DIAL SOAP AND SHOWER THE NIGHT BEFORE YOUR SURGERY AND THE MORNING OF YOUR SURGERY. PLEASE LET THE NURSE KNOW MORNING OF YOUR SURGERY IF YOU HAD ANY PROBLEMS WITH THE SURGICAL SOAP. ? ? ?________________________________________________________________________                  ?                                    ?  QUESTIONS CALL [facility] PRE OP NURSE PHONE [phone].                                    ?

## 2021-11-13 NOTE — Progress Notes (Addendum)
Spoke w/ via phone for pre-op interview---Vicky ?Lab needs dos----  urine pregnancy POCT             ?Lab results------11/28/21 lab appt for CBC, type & screen ?COVID test -----patient states asymptomatic no test needed ?Arrive at -------0530 on Tuesday, 12/02/21 ?NPO after MN NO Solid Food.  Clear liquids from MN until---0430 ?Med rec completed ?Medications to take morning of surgery -----Flonase ?Diabetic medication -----n/a ?Patient instructed no nail polish to be worn day of surgery ?Patient instructed to bring photo id and insurance card day of surgery ?Patient aware to have Driver (ride ) / caregiver    for 24 hours after surgery - husband, Coralyn Mark ?Patient Special Instructions -----Extended recovery instructions given. ?Pre-Op special Istructions -----Patient stated that she would wear glasses day of surgery and leave contact lenses at home. ?Patient verbalized understanding of instructions that were given at this phone interview. ?Patient denies shortness of breath, chest pain, fever, cough at this phone interview.  ?

## 2021-11-18 ENCOUNTER — Encounter (HOSPITAL_BASED_OUTPATIENT_CLINIC_OR_DEPARTMENT_OTHER): Payer: Self-pay | Admitting: Obstetrics & Gynecology

## 2021-11-18 ENCOUNTER — Telehealth: Payer: Self-pay | Admitting: Genetic Counselor

## 2021-11-18 ENCOUNTER — Other Ambulatory Visit (HOSPITAL_COMMUNITY)
Admission: RE | Admit: 2021-11-18 | Discharge: 2021-11-18 | Disposition: A | Discharge status: Home / Self Care | Payer: 59 | Source: Ambulatory Visit | Attending: Obstetrics & Gynecology | Admitting: Obstetrics & Gynecology

## 2021-11-18 ENCOUNTER — Ambulatory Visit (INDEPENDENT_AMBULATORY_CARE_PROVIDER_SITE_OTHER): Payer: 59 | Admitting: Obstetrics & Gynecology

## 2021-11-18 VITALS — BP 140/74 | HR 94 | Ht 61.0 in | Wt 118.8 lb

## 2021-11-18 DIAGNOSIS — N921 Excessive and frequent menstruation with irregular cycle: Secondary | ICD-10-CM | POA: Diagnosis not present

## 2021-11-18 DIAGNOSIS — D0501 Lobular carcinoma in situ of right breast: Secondary | ICD-10-CM

## 2021-11-18 DIAGNOSIS — D500 Iron deficiency anemia secondary to blood loss (chronic): Secondary | ICD-10-CM | POA: Insufficient documentation

## 2021-11-18 LAB — SURGICAL PATHOLOGY

## 2021-11-18 NOTE — Telephone Encounter (Signed)
Scheduled appt per 4/4 referral. Pt is aware of appt date and time. Pt is aware to arrive 15 mins prior to appt time and to bring and updated insurance card. Pt is aware of appt location.   ?

## 2021-11-18 NOTE — Progress Notes (Signed)
46 y.o. [id number] [name] here for discussion of upcoming procedure.  TLH, bilateral salpingectomy/cystoscopy planned due to uterine fibroids.  Pre-op evaluation thus far has included ultrasound showing uterus 12.4 x 7.7 x 10.4cm.   ? 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  Radiation has been recommended.  Additional tumor markers are pending.  Genetic counseling and genetic testing has been recommended.   ? ?Pt's results reviewed.  Pre-op hb of 7.9 noted.  Upon asking, pt reports that she was told her surgery for the breast might be cancelled.  Advised it should be cancelled for hysterectomy due to significant anemia.  Work-up and improvement in hemoglobin important prior to surgery, as it getting genetic testing completed.   ? ?Pt understands and is willing to proceed with what is needed.  Denies SOB or palpitations. ? ? ?Past Surgical History:  ?Procedure Laterality Date  ? BREAST LUMPECTOMY WITH RADIOACTIVE SEED LOCALIZATION Right 11/06/2021  ? Procedure: RIGHT BREAST LUMPECTOMY WITH RADIOACTIVE SEED LOCALIZATION;  Surgeon: Coralie Keens, MD;  Location: [facility];  Service: General;  Laterality: Right;  LMA  ? TUBAL LIGATION  2008  ? ? ?Past Medical History:  ?Diagnosis Date  ? Anemia 2022  ? chronic anemia, 10/28/21 Hgb 7.9 in Epic  ? Cancer ([facility]) 11/06/2021  ? right breast cancer- lobular carcinoma & ductal carcinoma / see 11/06/21 pathology report from right breast lumpectomy in Epic  ? COVID-19   ? late 2021 or early 2022 per pt, only mild symptoms  ? Fibroid   ? GERD (gastroesophageal reflux disease)   ? Menorrhagia   ? Wears contact lenses   ? Wears glasses   ? ? ?Allergies: Patient has no known allergies. ? ?Current Outpatient Medications  ?Medication Sig Dispense Refill  ? ferrous gluconate (FERGON) 324 MG tablet Take 1 tablet (324 mg total) by mouth every other day. 30 tablet 2  ? fluticasone (FLONASE)  50 MCG/ACT nasal spray Place into both nostrils daily.    ? magnesium hydroxide (MILK OF MAGNESIA) 400 MG/5ML suspension Take by mouth daily as needed for mild constipation.    ? norgestimate-ethinyl estradiol (ORTHO-CYCLEN) 0.25-35 MG-MCG tablet Take 1 tablet by mouth daily. 84 tablet 0  ? traMADol (ULTRAM) 50 MG tablet Take 1 tablet (50 mg total) by mouth every 6 (six) hours as needed. (Patient taking differently: Take 50 mg by mouth every 6 (six) hours as needed. Never picked up from pharmacy.) 25 tablet 0  ? docusate sodium (COLACE) 100 MG capsule Take 1 capsule (100 mg total) by mouth 2 (two) times daily as needed. (Patient not taking: Reported on 10/23/2021) 40 capsule 1  ? ?No current facility-administered medications for this visit.  ? ? ?ROS: Pertinent items noted in HPI and remainder of comprehensive ROS otherwise negative. ? ?Exam:   ?BP 140/74 (BP Location: Right Arm, Patient Position: Sitting, Cuff Size: Normal)   Pulse 94   Ht '5\' 1"'$  (1.549 m) Comment: reported  Wt 118 lb 12.8 oz (53.9 kg)   BMI 22.45 kg/m?   ?General appearance: alert and cooperative ?Abdomen: soft, non-tender; bowel sounds normal; no masses,  no organomegaly ?Extremities: extremities normal, atraumatic, no cyanosis or edema ?Skin: Skin color, texture, turgor normal. No rashes or lesions ?Lymph nodes: Cervical, supraclavicular, and axillary nodes normal. ?no inguinal nodes palpated ?Neurologic: Grossly normal ? ?Pelvic: External genitalia:  no lesions ?  Urethra: normal appearing urethra with no masses, tenderness or lesions ?             Bartholins and Skenes: normal    ?             Vagina: normal appearing vagina with normal color and discharge, no lesions ?             Cervix: normal appearance ?             Pap taken: No. ?       Bimanual Exam:  Uterus:  enlarged to 14 weeks, firm and not mobile ?                                     Adnexa:    normal adnexa in size, nontender and no masses ? ?Endometrial biopsy  recommended.  Discussed with patient.  Verbal and written consent obtained.   ?Procedure:  Speculum placed.  Cervix visualized and cleansed with betadine prep.  A single toothed tenaculum was applied to the anterior lip of the cervix.  Endometrial pipelle was advanced through the cervix into the endometrial cavity without difficulty.  Pipelle passed to8cm.  Suction applied and pipelle removed with good tissue sample obtained.  Tenculum removed.  No bleeding noted.  Patient tolerated procedure well. ? ?Chaperone, Octaviano Batty, present for examination.  ? ?Assessment/Plan: ?1. Menorrhagia with irregular cycle ?- endometrial biopsy obtained ?- Surgical pathology( [facility]/ [facility]) ?- for now, surgery will be delayed ? ?2. Iron deficiency anemia due to chronic blood loss ?- Iron, TIBC and Ferritin Panel ?- Surgical pathology( [facility] [place]/ [facility]) ? ?3. Lobular carcinoma in situ (LCIS) of right breast ?- Ambulatory referral to Genetics ? ? ? ? ?

## 2021-11-19 ENCOUNTER — Encounter (HOSPITAL_COMMUNITY): Payer: Self-pay

## 2021-11-19 ENCOUNTER — Encounter (HOSPITAL_BASED_OUTPATIENT_CLINIC_OR_DEPARTMENT_OTHER): Payer: Self-pay

## 2021-11-19 LAB — IRON,TIBC AND FERRITIN PANEL
Ferritin: 7 ng/mL — ABNORMAL LOW (ref 15–150)
Iron Saturation: 60 % — ABNORMAL HIGH (ref 15–55)
Iron: 341 ug/dL (ref 27–159)
Total Iron Binding Capacity: 566 ug/dL (ref 250–450)
UIBC: 225 ug/dL (ref 131–425)

## 2021-11-19 LAB — SURGICAL PATHOLOGY

## 2021-11-19 NOTE — Progress Notes (Signed)
New Breast Cancer Diagnosis: Right Breast UOQ ? ?Did patient present with symptoms (if so, please note symptoms) or screening mammography?: She was seen by her PCP who felt significant fullness in the upper outer quadrant of her right breast.  Mammogram was obtained and showed a 2.4 x 3.7 cm area of spiculation in the UOQ.  Biopsy was obtained and showed a complex grossing lesion with calcifications and atypical lobular hyperplasia. No malignancy was identified. ? ?Location and Extent of disease :right breast. Located in the upper outer quadrant, measured 3.7 cm in greatest dimension. Adenopathy no. ? ? ?Histology per Pathology Report: grade 2, LCIS, DCIS 11/06/2021 ? ?Receptor Status: ER(positive), PR (positive), Her2-neu (), Ki-(%) ? ? ?Surgeon and surgical plan, if any:  ?Dr. Ninfa Linden  ?-Right Breast Lumpectomy with radioactive seed localization 11/06/2021 ?Ductal carcinoma in situ (DCIS) of right breast ?- Ambulatory Referral to Oncology-Medical ?- Ambulatory Referral to Radiation Oncology ?- Ambulatory Referral to Genetics ?-Follow-up 11/17/2021. ? ? ?Medical oncologist, treatment if any:   ? ?Family History of Breast/Ovarian/Prostate Cancer: None ? ?Lymphedema issues, if any: No    ? ?Pain issues, if any: No   ? ?SAFETY ISSUES: ?Prior radiation? No ?Pacemaker/ICD? No ?Possible current pregnancy? Having Cycles, On birth control pills (non-stop since November 2022).  Total Laparoscopic Hysterectomy with Salpingectomy at the end of May. ?Is the patient on methotrexate? No ? ?Current Complaints / other details:   ?Has history of bleeding fibroids. ?

## 2021-11-20 ENCOUNTER — Inpatient Hospital Stay: Payer: 59 | Attending: Genetic Counselor | Admitting: Genetic Counselor

## 2021-11-20 ENCOUNTER — Telehealth: Payer: Self-pay | Admitting: Hematology and Oncology

## 2021-11-20 ENCOUNTER — Ambulatory Visit
Admission: RE | Admit: 2021-11-20 | Discharge: 2021-11-20 | Disposition: A | Discharge status: Home / Self Care | Payer: 59 | Source: Ambulatory Visit | Attending: Radiation Oncology | Admitting: Radiation Oncology

## 2021-11-20 ENCOUNTER — Other Ambulatory Visit: Payer: Self-pay

## 2021-11-20 ENCOUNTER — Inpatient Hospital Stay: Payer: 59

## 2021-11-20 ENCOUNTER — Encounter (HOSPITAL_BASED_OUTPATIENT_CLINIC_OR_DEPARTMENT_OTHER): Payer: Self-pay | Admitting: Obstetrics & Gynecology

## 2021-11-20 ENCOUNTER — Telehealth: Payer: Self-pay | Admitting: *Deleted

## 2021-11-20 ENCOUNTER — Other Ambulatory Visit: Payer: Self-pay | Admitting: Genetic Counselor

## 2021-11-20 ENCOUNTER — Encounter: Payer: Self-pay | Admitting: Genetic Counselor

## 2021-11-20 ENCOUNTER — Encounter: Payer: Self-pay | Admitting: Radiation Oncology

## 2021-11-20 DIAGNOSIS — Z79899 Other long term (current) drug therapy: Secondary | ICD-10-CM | POA: Diagnosis not present

## 2021-11-20 DIAGNOSIS — Z170 Estrogen receptor positive status [ER+]: Secondary | ICD-10-CM | POA: Diagnosis not present

## 2021-11-20 DIAGNOSIS — Z8616 Personal history of COVID-19: Secondary | ICD-10-CM | POA: Insufficient documentation

## 2021-11-20 DIAGNOSIS — D0511 Intraductal carcinoma in situ of right breast: Secondary | ICD-10-CM | POA: Diagnosis present

## 2021-11-20 DIAGNOSIS — K219 Gastro-esophageal reflux disease without esophagitis: Secondary | ICD-10-CM | POA: Diagnosis not present

## 2021-11-20 DIAGNOSIS — N939 Abnormal uterine and vaginal bleeding, unspecified: Secondary | ICD-10-CM | POA: Diagnosis not present

## 2021-11-20 LAB — GENETIC SCREENING ORDER

## 2021-11-20 NOTE — Telephone Encounter (Signed)
Scheduled appt per 4/4 referral. Pt is aware of appt date and time. Pt is aware to arrive 15 mins prior to appt time and to bring and updated insurance card. Pt is aware of appt location.   ?

## 2021-11-20 NOTE — Progress Notes (Signed)
REFERRING PROVIDER: ?Megan Salon, MD ?[phone] ?[address] ?[place],  [place] [address] ? ?PRIMARY PROVIDER:  ?Aletha Halim, MD ? ?PRIMARY REASON FOR VISIT:  ?1. Ductal carcinoma in situ (DCIS) of right breast   ? ? ? ?HISTORY OF PRESENT ILLNESS:   ?Frances Craig, a 46 y.o. female, was seen for a [facility] cancer genetics consultation at the request of Dr. Sabra Heck due to a personal history of DCIS breast cancer.  Frances Craig presents to clinic today to discuss the possibility of a hereditary predisposition to cancer, genetic testing, and to further clarify her future cancer risks, as well as potential cancer risks for family members.  ? ?In March 2023, at the age of 58, Frances Craig was diagnosed with DCIS of the breast.  The treatment plan includes lumpectomy.  ? ?CANCER HISTORY:  ?Oncology History  ? No history exists.  ? ? ? ?RISK FACTORS:  ?Menarche was at age 64.  ?First live birth at age 59.  ?OCP use for approximately 3 years.  ?Ovaries intact: yes.  ?Hysterectomy: no.  ?Menopausal status: premenopausal.  ?HRT use: 0 years. ?Colonoscopy: no; not examined. ?Mammogram within the last year: yes. ?Number of breast biopsies: 1. ?Up to date with pelvic exams: yes. ?Any excessive radiation exposure in the past: no ? ?Past Medical History:  ?Diagnosis Date  ? Anemia 2022  ? chronic anemia, 10/28/21 Hgb 7.9 in Epic  ? Cancer ([facility]) 11/06/2021  ? right breast cancer- lobular carcinoma & ductal carcinoma / see 11/06/21 pathology report from right breast lumpectomy in Epic  ? COVID-19   ? late 2021 or early 2022 per pt, only mild symptoms  ? Fibroid   ? GERD (gastroesophageal reflux disease)   ? Menorrhagia   ? Wears contact lenses   ? Wears glasses   ? ? ?Past Surgical History:  ?Procedure Laterality Date  ? BREAST LUMPECTOMY WITH RADIOACTIVE SEED LOCALIZATION Right 11/06/2021  ? Procedure: RIGHT BREAST LUMPECTOMY WITH RADIOACTIVE SEED LOCALIZATION;  Surgeon: Coralie Keens, MD;  Location: [facility];  Service:  General;  Laterality: Right;  LMA  ? TUBAL LIGATION  2008  ? ? ?Social History  ? ?Socioeconomic History  ? Marital status: Married  ?  Spouse name: Not on file  ? Number of children: 2  ? Years of education: Not on file  ? Highest education level: High school graduate  ?Occupational History  ? Not on file  ?Tobacco Use  ? Smoking status: Never  ? Smokeless tobacco: Never  ?Vaping Use  ? Vaping Use: Never used  ?Substance and Sexual Activity  ? Alcohol use: Never  ? Drug use: Never  ? Sexual activity: Yes  ?  Birth control/protection: Pill, Surgical  ?  Comment: tubal ligation  ?Other Topics Concern  ? Not on file  ?Social History Narrative  ? Not on file  ? ?Social Determinants of Health  ? ?Financial Resource Strain: Not on file  ?Food Insecurity: No Food Insecurity  ? Worried About Charity fundraiser in the Last Year: Never true  ? Ran Out of Food in the Last Year: Never true  ?Transportation Needs: No Transportation Needs  ? Lack of Transportation (Medical): No  ? Lack of Transportation (Non-Medical): No  ?Physical Activity: Not on file  ?Stress: Not on file  ?Social Connections: Not on file  ?  ? ?FAMILY HISTORY:  ?We obtained a detailed, 4-generation family history.  Significant diagnoses are listed below: ?Family History  ?Problem Relation Age of  Onset  ? Heart attack Mother   ? Skin cancer Father   ?     BCC  ? Fibroids Sister   ? Heart attack Maternal Uncle   ?     <50  ? Congestive Heart Failure Maternal Grandmother   ? Heart attack Maternal Grandfather   ? Heart attack Paternal Grandfather   ? ? ?The patient has a son and daughter who are cancer free.  She ha a brother and sister who are both cancer free.  Her mother died at 2 and her father is living. ? ?The patient's father had skin cancer.  He is an only child.  His father died of a heart attack and his mother died of a broken heart. ? ?The patient's mother died at 54 from a heart attack.  She had two sisters and two brothers.  One brother died  younger than 75 years from a heart attack.  The maternal grandparents are deceased.  The grandfather died of a heart attack and the grandmother died of CHF.  The grandfather had a great nephew who died of colon cancer at 52. ? ?Frances Craig is unaware of previous family history of genetic testing for hereditary cancer risks. Patient's maternal ancestors are of Caucasian descent, and paternal ancestors are of Caucasian descent. There is no reported Ashkenazi Jewish ancestry. There is no known consanguinity. ? ?GENETIC COUNSELING ASSESSMENT: Frances Craig is a 46 y.o. female with a personal history of DCIS breast cancer which is somewhat suggestive of a hereditary cancer syndrome and predisposition to cancer given her young age of onset of breast cancer. We, therefore, discussed and recommended the following at today's visit.  ? ?DISCUSSION: We discussed that, in general, most cancer is not inherited in families, but instead is sporadic or familial. Sporadic cancers occur by chance and typically happen at older ages (>50 years) as this type of cancer is caused by genetic changes acquired during an individual?s lifetime. Some families have more cancers than would be expected by chance; however, the ages or types of cancer are not consistent with a known genetic mutation or known genetic mutations have been ruled out. This type of familial cancer is thought to be due to a combination of multiple genetic, environmental, hormonal, and lifestyle factors. While this combination of factors likely increases the risk of cancer, the exact source of this risk is not currently identifiable or testable. ? ?We discussed that 5 - 10% of breast cancer is hereditary, with most cases associated with BRCA mutations.  There are other genes that can be associated with hereditary breast cancer syndromes.  These include ATM, CHEK2 and PALB2.  We discussed that testing is beneficial for several reasons including knowing how to follow  individuals after completing their treatment, identifying whether potential treatment options such as PARP inhibitors would be beneficial, and understand if other family members could be at risk for cancer and allow them to undergo genetic testing.  ? ?We reviewed the characteristics, features and inheritance patterns of hereditary cancer syndromes. We also discussed genetic testing, including the appropriate family members to test, the process of testing, insurance coverage and turn-around-time for results. We discussed the implications of a negative, positive, carrier and/or variant of uncertain significant result. We recommended Ms. Glynn pursue genetic testing for the CancerNext-Expanded+RNAinsight gene panel.  ? ?Based on Ms. Brummell's personal history of cancer, she meets medical criteria for genetic testing. Despite that she meets criteria, she may still have an out of pocket cost. We  discussed that if her out of pocket cost for testing is over $100, the laboratory will call and confirm whether she wants to proceed with testing.  If the out of pocket cost of testing is less than $100 she will be billed by the genetic testing laboratory.  ? ?PLAN: After considering the risks, benefits, and limitations, Ms. Zobrist provided informed consent to pursue genetic testing and the blood sample was sent to [facility] for analysis of the CancerNext-Expanded+RNAinsight panel. Results should be available within approximately 2-3 weeks' time, at which point they will be disclosed by telephone to Ms. Eckerman, as will any additional recommendations warranted by these results. Ms. Labarbera will receive a summary of her genetic counseling visit and a copy of her results once available. This information will also be available in Epic.  ? ?Lastly, we encouraged Ms. Bither to remain in contact with cancer genetics annually so that we can continuously update the family history and inform her of any changes in cancer  genetics and testing that may be of benefit for this family.  ? ?Ms. Nay's questions were answered to her satisfaction today. Our contact information was provided should additional questions or concerns arise. Thank you

## 2021-11-20 NOTE — Progress Notes (Signed)
?Radiation Oncology         [phone] ?________________________________ ? ?Name: Frances Craig        MRN: [id number]  ?Date of Service: 11/20/2021 DOB: January 15, 1976 ? ?CW:CBJSEGB, Eduard Clos, MD  Coralie Keens, MD    ? ?REFERRING PHYSICIAN: Coralie Keens, MD ? ? ?DIAGNOSIS: The encounter diagnosis was Ductal carcinoma in situ (DCIS) of right breast. ? ? ?HISTORY OF PRESENT ILLNESS: Frances Craig is a 46 y.o. female seen at the request of Dr. Ninfa Linden for new diagnosis of right breast cancer.  The patient presented to her PCP who noted a palpable area of fullness in the outer right breast.  Diagnostic imaging showed a mass in the upper outer quadrant of the right breast.  By ultrasound at 2.4 x 3.7 cm spiculated mass in the 10:30 position was identified, her axilla was negative for adenopathy.  A benign complicated cyst measuring 4 mm at the 5 o'clock position of the retroareolar breast was seen, and a 1.2 cm simple cyst at 10 o'clock position.  No abnormalities were noted of the left breast.  She underwent biopsies on 08/26/2021 of the right breast, which showed complex sclerosing lesion with lobular neoplasia and calcifications.  She met with Dr. Ninfa Linden and was taken for right lumpectomy on 11/06/2021.  Lobular carcinoma in situ involving a complex sclerosing lesion with calcifications was noted.  Her margins were negative for LCIS, fibrocystic change with usual ductal hyperplasia was noted with calcifications.  Additional superior and deep margin excision showed a focal area of intermediate grade ductal carcinoma in situ that measured 1 cm that was ER/PR positive .  Her margins were negative.  She had been working with her GYN for evaluation of symptoms of menorrhagia and iron deficiency anemia, and endometrial biopsy showed prolonged progesterone effect without hyperplasia or malignancy polyp or endometritis.  She is scheduled for a hysterectomy on 01/07/22. She is seen to discuss adjuvant  radiotherapy given the DCIS. ? ? ? ?PREVIOUS RADIATION THERAPY: No ? ? ?PAST MEDICAL HISTORY:  ?Past Medical History:  ?Diagnosis Date  ? Anemia 2022  ? chronic anemia, 10/28/21 Hgb 7.9 in Epic  ? Cancer ([facility]) 11/06/2021  ? right breast cancer- lobular carcinoma & ductal carcinoma / see 11/06/21 pathology report from right breast lumpectomy in Epic  ? COVID-19   ? late 2021 or early 2022 per pt, only mild symptoms  ? Fibroid   ? GERD (gastroesophageal reflux disease)   ? Menorrhagia   ? Wears contact lenses   ? Wears glasses   ?   ? ? ?PAST SURGICAL HISTORY: ?Past Surgical History:  ?Procedure Laterality Date  ? BREAST LUMPECTOMY WITH RADIOACTIVE SEED LOCALIZATION Right 11/06/2021  ? Procedure: RIGHT BREAST LUMPECTOMY WITH RADIOACTIVE SEED LOCALIZATION;  Surgeon: Coralie Keens, MD;  Location: [facility];  Service: General;  Laterality: Right;  LMA  ? TUBAL LIGATION  2008  ? ? ? ?FAMILY HISTORY:  ?Family History  ?Problem Relation Age of Onset  ? Fibroids Sister   ? ? ? ?SOCIAL HISTORY:  reports that she has never smoked. She has never used smokeless tobacco. She reports that she does not drink alcohol and does not use drugs. The patient is married and lives in [place]. 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  ? ? ?ALLERGIES: Patient has no known allergies. ? ? ?MEDICATIONS:  ?Current Outpatient Medications  ?Medication Sig Dispense Refill  ? docusate sodium (COLACE) 100 MG capsule Take 1  capsule (100 mg total) by mouth 2 (two) times daily as needed. (Patient not taking: Reported on 10/23/2021) 40 capsule 1  ? ferrous gluconate (FERGON) 324 MG tablet Take 1 tablet (324 mg total) by mouth every other day. 30 tablet 2  ? fluticasone (FLONASE) 50 MCG/ACT nasal spray Place into both nostrils daily.    ? magnesium hydroxide (MILK OF MAGNESIA) 400 MG/5ML suspension Take by mouth daily as needed for mild constipation.    ? norgestimate-ethinyl estradiol (ORTHO-CYCLEN) 0.25-35 MG-MCG tablet Take 1  tablet by mouth daily. 84 tablet 0  ? traMADol (ULTRAM) 50 MG tablet Take 1 tablet (50 mg total) by mouth every 6 (six) hours as needed. (Patient taking differently: Take 50 mg by mouth every 6 (six) hours as needed. Never picked up from pharmacy.) 25 tablet 0  ? ?No current facility-administered medications for this encounter.  ? ? ? ?REVIEW OF SYSTEMS: On review of systems, the patient reports that she is doing well overall other than constant abnormal uterine bleeding since November of last year despite multiple attempts at medical management. No other complaints are verbalized.  ? ?  ? ?PHYSICAL EXAM:  ?Wt Readings from Last 3 Encounters:  ?11/18/21 118 lb 12.8 oz (53.9 kg)  ?11/06/21 121 lb (54.9 kg)  ?10/28/21 121 lb 6.4 oz (55.1 kg)  ? ?Temp Readings from Last 3 Encounters:  ?11/06/21 98.5 ?F (36.9 ?C)  ?10/28/21 98.1 ?F (36.7 ?C) (Oral)  ?05/01/21 97.6 ?F (36.4 ?C) (Oral)  ? ?BP Readings from Last 3 Encounters:  ?11/18/21 140/74  ?11/06/21 118/78  ?10/28/21 125/79  ? ?Pulse Readings from Last 3 Encounters:  ?11/18/21 94  ?11/06/21 77  ?10/28/21 86  ? ? ?In general this is a well appearing caucasian female in no acute distress. She's alert and oriented x4 and appropriate throughout the examination. Cardiopulmonary assessment is negative for acute distress and she exhibits normal effort. Bilateral breast exam is deferred. ? ? ? ?ECOG = 1 ? ?0 - Asymptomatic (Fully active, able to carry on all predisease activities without restriction) ? ?1 - Symptomatic but completely ambulatory (Restricted in physically strenuous activity but ambulatory and able to carry out work of a light or sedentary nature. For example, light housework, office work) ? ?2 - Symptomatic, <50% in bed during the day (Ambulatory and capable of all self care but unable to carry out any work activities. Up and about more than 50% of waking hours) ? ?3 - Symptomatic, >50% in bed, but not bedbound (Capable of only limited self-care, confined to  bed or chair 50% or more of waking hours) ? ?4 - Bedbound (Completely disabled. Cannot carry on any self-care. Totally confined to bed or chair) ? ?5 - Death ? ? Oken MM, Creech RH, Tormey DC, et al. [phone]). "Toxicity and response criteria of the [facility]". [place] Oncol. 5 (6): 649-55 ? ? ? ?LABORATORY DATA:  ?Lab Results  ?Component Value Date  ? WBC 6.5 10/28/2021  ? HGB 7.9 (L) 10/28/2021  ? HCT 29.6 (L) 10/28/2021  ? MCV 74.4 (L) 10/28/2021  ? PLT 578 (H) 10/28/2021  ? ?Lab Results  ?Component Value Date  ? NA 134 (L) 05/01/2021  ? K 3.9 05/01/2021  ? CL 109 05/01/2021  ? CO2 21 (L) 05/01/2021  ? ?Lab Results  ?Component Value Date  ? ALT 7 05/01/2021  ? AST 11 (L) 05/01/2021  ? ALKPHOS 23 (L) 05/01/2021  ? BILITOT 0.1 (L) 05/01/2021  ? ?  ? ?  RADIOGRAPHY: MM Breast Surgical Specimen ? ?Result Date: 11/06/2021 ?CLINICAL DATA:  Biopsy-proven complex sclerosing lesion associated with atypical lobular hyperplasia involving the UPPER OUTER QUADRANT of the RIGHT breast. Radioactive seed localization was performed yesterday in anticipation of today's excisional biopsy. EXAM: SPECIMEN RADIOGRAPH OF THE RIGHT BREAST COMPARISON:  Previous exam(s). FINDINGS: Status post excision of the RIGHT breast. The initial specimen radiograph contains the radioactive seed which is intact. The second specimen contains the ribbon shaped tissue marking clip. The images were marked for pathology. The results were discussed telephone with the operating room nurse at time of interpretation. IMPRESSION: Specimen radiograph of the RIGHT breast. Electronically Signed   By: Evangeline Dakin M.D.   On: 11/06/2021 08:37 ? ?MM RT RADIOACTIVE SEED LOC MAMMO GUIDE ? ?Result Date: 11/05/2021 ?CLINICAL DATA:  46 year old with a biopsy-proven complex sclerosing lesion associated with lobular neoplasia (atypical lobular hyperplasia) involving the UPPER OUTER QUADRANT of the RIGHT breast. Radioactive seed localization is  performed in anticipation of excisional biopsy which is scheduled for tomorrow. EXAM: MAMMOGRAPHIC GUIDED RADIOACTIVE SEED LOCALIZATION OF THE RIGHT BREAST COMPARISON:  Previous exam(s). FINDINGS: Patient present

## 2021-11-20 NOTE — Telephone Encounter (Signed)
Called patient to inform of appt. with Dr. Noreene Filbert on 11-26-21- arrival time- 10 am, address- [address] [place], ph. no. [phone], spoke with patient and she is aware of this appt. ?

## 2021-11-25 ENCOUNTER — Telehealth: Payer: Self-pay | Admitting: Radiation Oncology

## 2021-11-25 ENCOUNTER — Encounter (HOSPITAL_BASED_OUTPATIENT_CLINIC_OR_DEPARTMENT_OTHER): Payer: Self-pay | Admitting: Obstetrics & Gynecology

## 2021-11-25 NOTE — Telephone Encounter (Signed)
4/11 @ 10:07 am spoke to patient she preferred to have treatments at [facility] closer to home with Dr. Baruch Gouty. ?

## 2021-11-26 ENCOUNTER — Encounter: Payer: Self-pay | Admitting: Radiation Oncology

## 2021-11-26 ENCOUNTER — Ambulatory Visit
Admission: RE | Admit: 2021-11-26 | Discharge: 2021-11-26 | Disposition: A | Discharge status: Home / Self Care | Payer: 59 | Source: Ambulatory Visit | Attending: Radiation Oncology | Admitting: Radiation Oncology

## 2021-11-26 VITALS — BP 129/86 | HR 100 | Temp 98.0°F | Ht 61.0 in | Wt 121.6 lb

## 2021-11-26 DIAGNOSIS — Z808 Family history of malignant neoplasm of other organs or systems: Secondary | ICD-10-CM | POA: Insufficient documentation

## 2021-11-26 DIAGNOSIS — Z79899 Other long term (current) drug therapy: Secondary | ICD-10-CM | POA: Insufficient documentation

## 2021-11-26 DIAGNOSIS — N920 Excessive and frequent menstruation with regular cycle: Secondary | ICD-10-CM | POA: Diagnosis not present

## 2021-11-26 DIAGNOSIS — K219 Gastro-esophageal reflux disease without esophagitis: Secondary | ICD-10-CM | POA: Diagnosis not present

## 2021-11-26 DIAGNOSIS — Z8616 Personal history of COVID-19: Secondary | ICD-10-CM | POA: Insufficient documentation

## 2021-11-26 DIAGNOSIS — D649 Anemia, unspecified: Secondary | ICD-10-CM | POA: Insufficient documentation

## 2021-11-26 DIAGNOSIS — Z170 Estrogen receptor positive status [ER+]: Secondary | ICD-10-CM | POA: Insufficient documentation

## 2021-11-26 DIAGNOSIS — D0511 Intraductal carcinoma in situ of right breast: Secondary | ICD-10-CM | POA: Insufficient documentation

## 2021-11-26 NOTE — Consult Note (Signed)
?NEW PATIENT EVALUATION ? ?Name: Frances Craig  ?MRN: [id number]  ?Date:   11/26/2021     ?DOB: 05-Apr-1976 ? ? ?This 46 y.o. 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 ? ?REFERRING PHYSICIAN: Aletha Halim, MD ? ?CHIEF COMPLAINT:  ?Chief Complaint  ?Patient presents with  ? Consult  ?  Breast  ? ? ?DIAGNOSIS: The encounter diagnosis was Ductal carcinoma in situ (DCIS) of right breast. ?  ?PREVIOUS INVESTIGATIONS:  ?Mammogram and ultrasound reviewed ?Pathology reports reviewed ?Clinical notes reviewed ? ?HPI: Patient is a 46 year old female who presents with an abnormal mammogram of her right breast showing a suspicious 3.7 lesion in the upper outer right breast.  On ultrasound no abnormal appearing right axilla lymph nodes were noted.  She went on to have a wide local excision for a 1 cm area of DCIS with margins clear at 2 mm posteriorly.  No lymph nodes were submitted for evaluation.  She is seen today for evaluation for whole breast radiation.  She specifically denies breast tenderness cough or bone pain.  Patient is scheduled at the end of May for a hysterectomy secondary to postmenopausal bleeding. ? ?PLANNED TREATMENT REGIMEN: Hypofractionated right whole breast radiation ? ?PAST MEDICAL HISTORY:  has a past medical history of Anemia (2022), Cancer ([place]) (11/06/2021), COVID-19, Fibroid, GERD (gastroesophageal reflux disease), Menorrhagia, Wears contact lenses, and Wears glasses.   ? ?PAST SURGICAL HISTORY:  ?Past Surgical History:  ?Procedure Laterality Date  ? BREAST LUMPECTOMY WITH RADIOACTIVE SEED LOCALIZATION Right 11/06/2021  ? Procedure: RIGHT BREAST LUMPECTOMY WITH RADIOACTIVE SEED LOCALIZATION;  Surgeon: Coralie Keens, MD;  Location: [facility];  Service: General;  Laterality: Right;  LMA  ? TUBAL LIGATION  2008  ? ? ?FAMILY HISTORY: family history includes Congestive  Heart Failure in her maternal grandmother; Fibroids in her sister; Heart attack in her maternal grandfather, maternal uncle, mother, and paternal grandfather; Skin cancer in her father. ? ?SOCIAL HISTORY:  reports that she has never smoked. She has never used smokeless tobacco. She reports that she does not drink alcohol and does not use drugs. ? ?ALLERGIES: Patient has no known allergies. ? ?MEDICATIONS:  ?Current Outpatient Medications  ?Medication Sig Dispense Refill  ? docusate sodium (COLACE) 100 MG capsule Take 1 capsule (100 mg total) by mouth 2 (two) times daily as needed. (Patient not taking: Reported on 10/23/2021) 40 capsule 1  ? ferrous gluconate (FERGON) 324 MG tablet Take 1 tablet (324 mg total) by mouth every other day. 30 tablet 2  ? fluticasone (FLONASE) 50 MCG/ACT nasal spray Place into both nostrils daily.    ? magnesium hydroxide (MILK OF MAGNESIA) 400 MG/5ML suspension Take by mouth daily as needed for mild constipation.    ? norgestimate-ethinyl estradiol (ORTHO-CYCLEN) 0.25-35 MG-MCG tablet Take 1 tablet by mouth daily. 84 tablet 0  ? traMADol (ULTRAM) 50 MG tablet Take 1 tablet (50 mg total) by mouth every 6 (six) hours as needed. (Patient taking differently: Take 50 mg by mouth every 6 (six) hours as needed. Never picked up from pharmacy.) 25 tablet 0  ? ?No current facility-administered medications for this encounter.  ? ? ?ECOG PERFORMANCE STATUS:  0 - Asymptomatic ? ?REVIEW OF SYSTEMS: ?Patient denies any weight loss, fatigue, weakness, fever, chills or night sweats. Patient denies any loss of vision, blurred vision. Patient denies any ringing  of the ears or  hearing loss. No irregular heartbeat. Patient denies heart murmur or history of fainting. Patient denies any chest pain or pain radiating to her upper extremities. Patient denies any shortness of breath, difficulty breathing at night, cough or hemoptysis. Patient denies any swelling in the lower legs. Patient denies any nausea  vomiting, vomiting of blood, or coffee ground material in the vomitus. Patient denies any stomach pain. Patient states has had normal bowel movements no significant constipation or diarrhea. Patient denies any dysuria, hematuria or significant nocturia. Patient denies any problems walking, swelling in the joints or loss of balance. Patient denies any skin changes, loss of hair or loss of weight. Patient denies any excessive worrying or anxiety or significant depression. Patient denies any problems with insomnia. Patient denies excessive thirst, polyuria, polydipsia. Patient denies any swollen glands, patient denies easy bruising or easy bleeding. Patient denies any recent infections, allergies or URI. Patient "s visual fields have not changed significantly in recent time. ?  ?PHYSICAL EXAM: ?BP 129/86   Pulse 100   Temp 98 ?F (36.7 ?C)   Ht '5\' 1"'$  (1.549 m)   Wt 121 lb 9.6 oz (55.2 kg)   BMI 22.98 kg/m?  ?Right breast has a well-healed incision around the nipple areolar complex of the right breast.  No dominant masses noted in either breast.  No axillary or supraclavicular adenopathy is appreciated.  Well-developed well-nourished patient in NAD. HEENT reveals PERLA, EOMI, discs not visualized.  Oral cavity is clear. No oral mucosal lesions are identified. Neck is clear without evidence of cervical or supraclavicular adenopathy. Lungs are clear to A&P. Cardiac examination is essentially unremarkable with regular rate and rhythm without murmur rub or thrill. Abdomen is benign with no organomegaly or masses noted. Motor sensory and DTR levels are equal and symmetric in the upper and lower extremities. Cranial nerves II through XII are grossly intact. Proprioception is intact. No peripheral adenopathy or edema is identified. No motor or sensory levels are noted. Crude visual fields are within normal range. ? ?LABORATORY DATA: Pathology reports reviewed ? ?  ?RADIOLOGY RESULTS: Mammogram and ultrasound reviewed  compatible with above-stated findings ? ? ?IMPRESSION: DCIS of the right breast status post wide local excision in 46 year old female ? ?PLAN: According to the patient her tumor is ER positive although I do not see that formal report in her labs.  I have recommended a hypofractionated course of whole breast radiation to her right breast over 3 weeks.  Would also boost her scar another 1000 centigrade based on the close 2 mm margin.  We will have all this accomplished and performed before her hysterectomy the end of May.  I have personally set up and ordered CT simulation for early next week and will speed her entrance into treatment.  Risks and benefits of treatment including skin reaction fatigue alteration blood counts possible inclusion of superficial lung all were reviewed in detail with the patient.  She seems to comprehend our treatment plan well. ? ?I would like to take this opportunity to thank you for allowing me to participate in the care of your patient.. ? ?Noreene Filbert, MD ? ? ? ? ? ? ? ? ?

## 2021-11-27 ENCOUNTER — Inpatient Hospital Stay (HOSPITAL_BASED_OUTPATIENT_CLINIC_OR_DEPARTMENT_OTHER): Payer: 59 | Admitting: Hematology and Oncology

## 2021-11-27 ENCOUNTER — Inpatient Hospital Stay: Payer: 59

## 2021-11-27 ENCOUNTER — Encounter: Payer: Self-pay | Admitting: Hematology and Oncology

## 2021-11-27 ENCOUNTER — Other Ambulatory Visit: Payer: Self-pay

## 2021-11-27 DIAGNOSIS — Z170 Estrogen receptor positive status [ER+]: Secondary | ICD-10-CM | POA: Diagnosis not present

## 2021-11-27 DIAGNOSIS — D0511 Intraductal carcinoma in situ of right breast: Secondary | ICD-10-CM | POA: Diagnosis not present

## 2021-11-27 NOTE — Assessment & Plan Note (Signed)
Pathology review: I discussed with the patient the difference between DCIS and invasive breast cancer. It is considered a precancerous lesion. DCIS is classified as a Stage 0 breast cancer. It is generally detected through mammograms as calcifications. We discussed the significance of grades and its impact on prognosis. We also discussed the importance of ER and PR receptors and their implications to adjuvant treatment options. Prognosis of DCIS dependence on grade and degree of comedo necrosis. It is anticipated that if not treated, 20-30% of DCIS can develop into invasive breast cancer. ? ?Recommendation: ?1. Breast conserving surgery ?2. Followed by adjuvant radiation therapy ?3. Followed by antiestrogen therapy with tamoxifen/aromatase inhibitors based on menopausal status ?5 years ? ?Tamoxifen counseling: We discussed the risks and benefits of tamoxifen. These include but not limited to insomnia, hot flashes, mood changes, vaginal dryness, and weight gain. Although rare, serious side effects including endometrial cancer, risk of blood clots were also discussed. We strongly believe that the benefits far outweigh the risks. Patient understands these risks and consented to starting treatment. Planned treatment duration is 5 years. ? ?Since she is premenopausal, she cannot receive aromatase inhibitors at this time.  If she undergoes bilateral oophorectomy along with hysterectomy for genetic result reason, then she can consider antiestrogen therapy with tamoxifen or aromatase inhibitors. ?She will proceed with adjuvant radiation.  All her questions were answered to the best of my knowledge.  She will return to clinic at the end of June to initiate antiestrogen therapy. ?

## 2021-11-27 NOTE — Progress Notes (Signed)
[facility] ?CONSULT NOTE ? ?Patient Care Team: ?Aletha Halim, MD as PCP - General (Obstetrics and Gynecology) ?Mauro Kaufmann, RN as Oncology Nurse Navigator ?Rockwell Germany, RN as Oncology Nurse Navigator ? ?CHIEF COMPLAINTS/PURPOSE OF CONSULTATION:  ?Newly diagnosed breast cancer ? ?HISTORY OF PRESENTING ILLNESS:  ?Frances Craig 46 y.o. female is here because of recent diagnosis of right breast DCIS and LCIS ? ?Patient had a diagnostic mammogram bilaterally which showed multiple abnormal findings in the right breast which showed 2.4 x 1 x 3.7 cm irregular spiculated mass at the 10:30 position 3 cm from the nipple, no abnormal right axillary lymph nodes noted.  A 4 x 3 x 4 mm benign complicated cyst at the 5 o'clock position of the retroareolar breast, 1.2 x 1.7 x 1.0 cm benign simple cyst at the 10 o'clock position 1 cm from the nipple.  No sonographic abnormalities in the upper outer left breast.  Ultrasound-guided right breast biopsy was scheduled ?Ultrasound of the breast and biopsy showed complex sclerosing lesion with lobular neoplasia and calcifications, atypical lobular hyperplasia. 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 ?She is now referred to breast clinic for further recommendations.  She arrived to the appointment today with her daughter.  She is otherwise healthy except for underlying menorrhagia and is scheduled for hysterectomy at the end of May for the same.  She is contemplating if she needs bilateral oophorectomy as well along with hysterectomy.  She has  underlying iron deficiency anemia secondary to menorrhagia and needs intermittent iron supplementation in the IV ? ?I reviewed her records extensively and collaborated the history with the patient. ? ?SUMMARY OF ONCOLOGIC HISTORY: ?Oncology History  ? No history exists.  ? ? ? ?MEDICAL HISTORY:  ?Past Medical History:  ?Diagnosis Date  ? Anemia 2022  ? chronic anemia, 10/28/21 Hgb 7.9 in Epic  ? Cancer ([place]) 11/06/2021  ? right breast cancer- lobular carcinoma & ductal carcinoma / see 11/06/21 pathology report from right breast lumpectomy in Epic  ? COVID-19   ? late 2021 or early 2022 per pt, only mild symptoms  ? Fibroid   ? GERD (gastroesophageal reflux disease)   ? Menorrhagia   ? Wears contact lenses   ? Wears glasses   ? ? ?SURGICAL HISTORY: ?Past Surgical History:  ?Procedure Laterality Date  ? BREAST LUMPECTOMY WITH RADIOACTIVE SEED LOCALIZATION Right 11/06/2021  ? Procedure: RIGHT BREAST LUMPECTOMY WITH RADIOACTIVE SEED LOCALIZATION;  Surgeon: Coralie Keens, MD;  Location: [facility];  Service: General;  Laterality: Right;  LMA  ? TUBAL LIGATION  2008  ? ? ?SOCIAL HISTORY: ?Social History  ? ?Socioeconomic History  ? Marital status: Married  ?  Spouse name: Not on file  ? Number of children: 2  ? Years of education: Not on file  ? Highest education level: High school graduate  ?Occupational History  ? Not on file  ?Tobacco Use  ? Smoking status: Never  ? Smokeless tobacco: Never  ?Vaping Use  ? Vaping Use: Never used  ?Substance and Sexual Activity  ? Alcohol use: Never  ? Drug use: Never  ? Sexual activity: Yes  ?  Birth control/protection: Pill, Surgical  ?  Comment: tubal ligation  ?Other Topics Concern  ? Not on file  ?Social History Narrative  ? Not on file  ? ?Social Determinants of Health  ? ?Financial Resource Strain: Not on file  ?Food Insecurity: No Food Insecurity  ? Worried About Charity fundraiser in the Last Year: Never true  ? Ran Out of Food in the Last Year: Never true  ?Transportation Needs:  No Transportation Needs  ? Lack of Transportation (Medical): No  ? Lack of Transportation (Non-Medical): No  ?Physical Activity: Not on file  ?Stress: Not on file  ?Social Connections: Not on file  ?Intimate Partner Violence: Not At Risk  ? Fear of Current or Ex-Partner: No  ? Emotionally Abused: No  ? Physically Abused: No  ? Sexually Abused: No  ? ? ?FAMILY HISTORY: ?Family History  ?Problem Relation Age of Onset  ? Heart attack Mother   ? Skin cancer Father   ?     BCC  ? Fibroids Sister   ? Heart attack Maternal Uncle   ?     <50  ? Congestive Heart Failure Maternal Grandmother   ? Heart attack Maternal Grandfather   ? Heart attack Paternal Grandfather   ? ? ?ALLERGIES:  has No Known Allergies. ? ?MEDICATIONS:  ?Current Outpatient Medications  ?Medication Sig Dispense Refill  ? docusate sodium (COLACE) 100 MG capsule Take 1 capsule (100 mg total) by mouth 2 (two) times daily as needed. (Patient not taking: Reported on 10/23/2021) 40 capsule 1  ? ferrous gluconate (FERGON) 324 MG tablet Take 1 tablet (324 mg total) by mouth every other day. 30 tablet 2  ? fluticasone (FLONASE) 50 MCG/ACT nasal spray Place into both nostrils daily.    ? magnesium hydroxide (MILK OF MAGNESIA) 400 MG/5ML suspension Take by mouth daily as needed for mild constipation.    ? norgestimate-ethinyl estradiol (ORTHO-CYCLEN) 0.25-35 MG-MCG tablet Take 1 tablet by mouth daily. 84 tablet 0  ? traMADol (ULTRAM) 50 MG tablet Take 1 tablet (50 mg total) by mouth every 6 (six) hours as needed. (Patient taking differently: Take 50 mg by mouth every 6 (six) hours as needed. Never picked up from pharmacy.) 25 tablet 0  ? ?No current facility-administered medications for this visit.  ? ? ?Rest of the pertinent 10 point ROS reviewed and negative ? ?PHYSICAL EXAMINATION: ?ECOG PERFORMANCE STATUS: 0 - Asymptomatic ? ?Vitals:  ? 11/27/21 1029  ?BP: 130/71  ?Pulse: 95  ?Resp: 18  ?Temp: 97.9 ?F (36.6 ?C)  ?SpO2: 100%  ? ?Filed Weights  ? 11/27/21 1029   ?Weight: 121 lb 6.4 oz (55.1 kg)  ? ?Physical Exam ?Constitutional:   ?   Appearance: Normal appearance.  ?Cardiovascular:  ?   Rate and Rhythm: Normal rate and regular rhythm.  ?   Pulses: Normal pulses.  ?   Heart sounds: Normal heart sounds.  ?Pulmonary:  ?   Effort: Pulmonary effort is normal.  ?   Breath sounds: Normal breath sounds.  ?Abdominal:  ?   General: Abdomen is flat. Bowel sounds are normal.  ?   Palpations: Abdomen is soft.  ?Musculoskeletal:  ?   Cervical back: Normal range of motion and neck supple. No rigidity.  ?Lymphadenopathy:  ?   Cervical: No cervical adenopathy.  ?Neurological:  ?   Mental Status: She is alert.  ? ? ? ?LABORATORY DATA:  ?I have reviewed the data as listed ?Lab Results  ?Component Value Date  ? WBC 6.5 10/28/2021  ? HGB  7.9 (L) 10/28/2021  ? HCT 29.6 (L) 10/28/2021  ? MCV 74.4 (L) 10/28/2021  ? PLT 578 (H) 10/28/2021  ? ?Lab Results  ?Component Value Date  ? NA 134 (L) 05/01/2021  ? K 3.9 05/01/2021  ? CL 109 05/01/2021  ? CO2 21 (L) 05/01/2021  ? ? ?RADIOGRAPHIC STUDIES: ?I have personally reviewed the radiological reports and agreed with the findings in the report. ? ?ASSESSMENT AND PLAN:  ?Ductal carcinoma in situ (DCIS) of right breast ?Pathology review: I discussed with the patient the difference between DCIS and invasive breast cancer. It is considered a precancerous lesion. DCIS is classified as a Stage 0 breast cancer. It is generally detected through mammograms as calcifications. We discussed the significance of grades and its impact on prognosis. We also discussed the importance of ER and PR receptors and their implications to adjuvant treatment options. Prognosis of DCIS dependence on grade and degree of comedo necrosis. It is anticipated that if not treated, 20-30% of DCIS can develop into invasive breast cancer. ? ?Recommendation: ?1. Breast conserving surgery ?2. Followed by adjuvant radiation therapy ?3. Followed by antiestrogen therapy with  tamoxifen/aromatase inhibitors based on menopausal status ?5 years ? ?Tamoxifen counseling: We discussed the risks and benefits of tamoxifen. These include but not limited to insomnia, hot flashes, mood changes, vaginal dryness, an

## 2021-11-28 ENCOUNTER — Encounter (HOSPITAL_COMMUNITY): Payer: 59

## 2021-11-28 ENCOUNTER — Encounter: Payer: Self-pay | Admitting: *Deleted

## 2021-12-01 ENCOUNTER — Ambulatory Visit
Admission: RE | Admit: 2021-12-01 | Discharge: 2021-12-01 | Disposition: A | Discharge status: Home / Self Care | Payer: 59 | Source: Ambulatory Visit | Attending: Radiation Oncology | Admitting: Radiation Oncology

## 2021-12-01 DIAGNOSIS — Z510 Encounter for antineoplastic radiation therapy: Secondary | ICD-10-CM | POA: Diagnosis not present

## 2021-12-01 DIAGNOSIS — D0511 Intraductal carcinoma in situ of right breast: Secondary | ICD-10-CM | POA: Diagnosis present

## 2021-12-02 ENCOUNTER — Telehealth: Payer: Self-pay | Admitting: Licensed Clinical Social Worker

## 2021-12-02 DIAGNOSIS — Z510 Encounter for antineoplastic radiation therapy: Secondary | ICD-10-CM | POA: Diagnosis not present

## 2021-12-02 NOTE — Telephone Encounter (Signed)
[facility] ?Clinical Social Work ? ?Clinical Social Work was referred by  new patient protocol  for assessment of psychosocial needs.  Clinical Social Worker  attempted to reach pt by phone   to offer support and assess for needs.  No answer. Left VM with direct contact information. ? ? ? ? ? ?Joao Mccurdy E Ura Yingling, LCSW  ?Clinical Social Worker ?[place] ?      ? ?

## 2021-12-03 ENCOUNTER — Ambulatory Visit: Admission: RE | Admit: 2021-12-03 | Payer: 59 | Source: Ambulatory Visit

## 2021-12-03 DIAGNOSIS — Z510 Encounter for antineoplastic radiation therapy: Secondary | ICD-10-CM | POA: Diagnosis not present

## 2021-12-04 ENCOUNTER — Ambulatory Visit
Admission: RE | Admit: 2021-12-04 | Discharge: 2021-12-04 | Disposition: A | Discharge status: Home / Self Care | Payer: 59 | Source: Ambulatory Visit | Attending: Radiation Oncology | Admitting: Radiation Oncology

## 2021-12-04 ENCOUNTER — Telehealth: Payer: Self-pay | Admitting: Genetic Counselor

## 2021-12-04 ENCOUNTER — Ambulatory Visit: Payer: Self-pay | Admitting: Genetic Counselor

## 2021-12-04 ENCOUNTER — Encounter: Payer: Self-pay | Admitting: Genetic Counselor

## 2021-12-04 ENCOUNTER — Other Ambulatory Visit: Payer: Self-pay

## 2021-12-04 DIAGNOSIS — Z1379 Encounter for other screening for genetic and chromosomal anomalies: Secondary | ICD-10-CM

## 2021-12-04 DIAGNOSIS — D0511 Intraductal carcinoma in situ of right breast: Secondary | ICD-10-CM

## 2021-12-04 DIAGNOSIS — Z510 Encounter for antineoplastic radiation therapy: Secondary | ICD-10-CM | POA: Diagnosis not present

## 2021-12-04 LAB — RAD ONC ARIA SESSION SUMMARY
Course Elapsed Days: 0
Plan Fractions Treated to Date: 1
Plan Prescribed Dose Per Fraction: 2.66 Gy
Plan Total Fractions Prescribed: 16
Plan Total Prescribed Dose: 42.56 Gy
Reference Point Dosage Given to Date: 2.66 Gy
Reference Point Session Dosage Given: 2.66 Gy
Session Number: 1

## 2021-12-04 NOTE — Telephone Encounter (Signed)
Revealed negative genetic testing.  Discussed that we do not know why she has breast cancer or why there is cancer in the family. It could be due to a different gene that we are not testing, or maybe our current technology may not be able to pick something up.  It will be important for her to keep in contact with genetics to keep up with whether additional testing may be needed. ? ?Discussed referral to Cardiology genetics due to family history of early heart attack (mother under 53, maternal uncle under 44).  Patient agreeable to that.  Patinet states she has never had her cholesterol checked.  We recommend that also be done. ? ? ?

## 2021-12-04 NOTE — Progress Notes (Signed)
HPI:  Frances Craig was previously seen in the [facility] clinic due to a personal history of breast cancer and concerns regarding a hereditary predisposition to cancer. Please refer to our prior cancer genetics clinic note for more information regarding our discussion, assessment and recommendations, at the time. Ms. Venne recent genetic test results were disclosed to her, as were recommendations warranted by these results. These results and recommendations are discussed in more detail below. ? ?CANCER HISTORY:  ?Oncology History  ?Ductal carcinoma in situ (DCIS) of right breast  ?11/20/2021 Initial Diagnosis  ? Ductal carcinoma in situ (DCIS) of right breast ?  ?12/02/2021 Genetic Testing  ? Negative genetic testing on the CancerNext-Expanded+RNAinsight panel.  The report date is 12/02/2021. ? ?The CancerNext-Expanded gene panel offered by [facility] and includes sequencing and rearrangement analysis for the following 77 genes: AIP, ALK, APC*, ATM*, AXIN2, BAP1, BARD1, BLM, BMPR1A, BRCA1*, BRCA2*, BRIP1*, CDC73, CDH1*, CDK4, CDKN1B, CDKN2A, CHEK2*, CTNNA1, DICER1, FANCC, FH, FLCN, GALNT12, KIF1B, LZTR1, MAX, MEN1, MET, MLH1*, MSH2*, MSH3, MSH6*, MUTYH*, NBN, NF1*, NF2, NTHL1, PALB2*, PHOX2B, PMS2*, POT1, PRKAR1A, PTCH1, PTEN*, RAD51C*, RAD51D*, RB1, RECQL, RET, SDHA, SDHAF2, SDHB, SDHC, SDHD, SMAD4, SMARCA4, SMARCB1, SMARCE1, STK11, SUFU, TMEM127, TP53*, TSC1, TSC2, VHL and XRCC2 (sequencing and deletion/duplication); EGFR, EGLN1, HOXB13, KIT, MITF, PDGFRA, POLD1, and POLE (sequencing only); EPCAM and GREM1 (deletion/duplication only). DNA and RNA analyses performed for * genes.  ?  ? ? ?FAMILY HISTORY:  ?We obtained a detailed, 4-generation family history.  Significant diagnoses are listed below: ?Family History  ?Problem Relation Age of Onset  ? Heart attack Mother   ? Skin cancer Father   ?     BCC  ? Fibroids Sister   ? Heart attack Maternal Uncle   ?     <50  ? Congestive Heart Failure  Maternal Grandmother   ? Heart attack Maternal Grandfather   ? Heart attack Paternal Grandfather   ? ? ? ? ?The patient has a son and daughter who are cancer free.  She has a brother and sister who are both cancer free.  Her mother died at 37 and her father is living. ?  ?The patient's father had skin cancer.  He is an only child.  His father died of a heart attack and his mother died of a broken heart. ?  ?The patient's mother died at 50 from a heart attack.  She had two sisters and two brothers.  One brother died younger than 35 years from a heart attack.  The maternal grandparents are deceased.  The grandfather died of a heart attack and the grandmother died of CHF.  The grandfather had a great nephew who died of colon cancer at 17. ?  ?Ms. Anspach is unaware of previous family history of genetic testing for hereditary cancer risks. Patient's maternal ancestors are of Caucasian descent, and paternal ancestors are of Caucasian descent. There is no reported Ashkenazi Jewish ancestry. There is no known consanguinity ? ?GENETIC TEST RESULTS: Genetic testing reported out on December 02, 2021 through the CancerNext-Expanded+RNAinsight cancer panel found no pathogenic mutations. The CancerNext-Expanded gene panel offered by [facility] and includes sequencing and rearrangement analysis for the following 77 genes: AIP, ALK, APC*, ATM*, AXIN2, BAP1, BARD1, BLM, BMPR1A, BRCA1*, BRCA2*, BRIP1*, CDC73, CDH1*, CDK4, CDKN1B, CDKN2A, CHEK2*, CTNNA1, DICER1, FANCC, FH, FLCN, GALNT12, KIF1B, LZTR1, MAX, MEN1, MET, MLH1*, MSH2*, MSH3, MSH6*, MUTYH*, NBN, NF1*, NF2, NTHL1, PALB2*, PHOX2B, PMS2*, POT1, PRKAR1A, PTCH1, PTEN*, RAD51C*, RAD51D*, RB1, RECQL, RET,  SDHA, SDHAF2, SDHB, SDHC, SDHD, SMAD4, SMARCA4, SMARCB1, SMARCE1, STK11, SUFU, TMEM127, TP53*, TSC1, TSC2, VHL and XRCC2 (sequencing and deletion/duplication); EGFR, EGLN1, HOXB13, KIT, MITF, PDGFRA, POLD1, and POLE (sequencing only); EPCAM and GREM1 (deletion/duplication  only). DNA and RNA analyses performed for * genes. The test report has been scanned into EPIC and is located under the Molecular Pathology section of the Results Review tab.  A portion of the result report is included below for reference.  ? ? ? ?We discussed with Ms. Begeman that because current genetic testing is not perfect, it is possible there may be a gene mutation in one of these genes that current testing cannot detect, but that chance is small.  We also discussed, that there could be another gene that has not yet been discovered, or that we have not yet tested, that is responsible for the cancer diagnoses in the family. It is also possible there is a hereditary cause for the cancer in the family that Ms. Almon did not inherit and therefore was not identified in her testing.  Therefore, it is important to remain in touch with cancer genetics in the future so that we can continue to offer Ms. Strandberg the most up to date genetic testing.  ? ?ADDITIONAL GENETIC TESTING: We discussed with Ms. Yontz that her genetic testing was fairly extensive.  If there are genes identified to increase cancer risk that can be analyzed in the future, we would be happy to discuss and coordinate this testing at that time.   ? ?CANCER SCREENING RECOMMENDATIONS: Ms. Delangel test result is considered negative (normal).  This means that we have not identified a hereditary cause for her personal history of breast cancer at this time. Most cancers happen by chance and this negative test suggests that her cancer may fall into this category.   ? ?While reassuring, this does not definitively rule out a hereditary predisposition to cancer. It is still possible that there could be genetic mutations that are undetectable by current technology. There could be genetic mutations in genes that have not been tested or identified to increase cancer risk.  Therefore, it is recommended she continue to follow the cancer management and screening  guidelines provided by her oncology and primary healthcare provider.  ? ?An individual's cancer risk and medical management are not determined by genetic test results alone. Overall cancer risk assessment incorporates additional factors, including personal medical history, family history, and any available genetic information that may result in a personalized plan for cancer prevention and surveillance ? ?RECOMMENDATIONS FOR FAMILY MEMBERS:  Individuals in this family might be at some increased risk of developing cancer, over the general population risk, simply due to the family history of cancer.  We recommended women in this family have a yearly mammogram beginning at age 2, or 34 years younger than the earliest onset of cancer, an annual clinical breast exam, and perform monthly breast self-exams. Women in this family should also have a gynecological exam as recommended by their primary provider. All family members should be referred for colonoscopy starting at age 20. ? ?Based on the family history of early death from heart attack (mother died at 22, maternal uncle died under age 68, maternal grandfather died of heart attack), we are concerned for a hereditary cardiology condition called Hypercholesterolemia.  We discussed that Ms. Rheaume should get her cholesterol checked and also consider being seen in the cardiology genetic clinic.  She has agreed to this referral. ? ?FOLLOW-UP: Lastly, we discussed  with Ms. Tilley that cancer genetics is a rapidly advancing field and it is possible that new genetic tests will be appropriate for her and/or her family members in the future. We encouraged her to remain in contact with cancer genetics on an annual basis so we can update her personal and family histories and let her know of advances in cancer genetics that may benefit this family.  ? ?Our contact number was provided. Ms. Radcliffe questions were answered to her satisfaction, and she knows she is welcome to  call us at anytime with additional questions or concerns.  ? ?Roma Kayser, MS, [place] ?Licensed, Insurance risk surveyor ?[email or web address] ? ?

## 2021-12-05 ENCOUNTER — Ambulatory Visit
Admission: RE | Admit: 2021-12-05 | Discharge: 2021-12-05 | Disposition: A | Discharge status: Home / Self Care | Payer: 59 | Source: Ambulatory Visit | Attending: Radiation Oncology | Admitting: Radiation Oncology

## 2021-12-05 ENCOUNTER — Other Ambulatory Visit: Payer: Self-pay

## 2021-12-05 DIAGNOSIS — Z510 Encounter for antineoplastic radiation therapy: Secondary | ICD-10-CM | POA: Diagnosis not present

## 2021-12-05 LAB — RAD ONC ARIA SESSION SUMMARY
Course Elapsed Days: 1
Plan Fractions Treated to Date: 2
Plan Prescribed Dose Per Fraction: 2.66 Gy
Plan Total Fractions Prescribed: 16
Plan Total Prescribed Dose: 42.56 Gy
Reference Point Dosage Given to Date: 5.32 Gy
Reference Point Session Dosage Given: 2.66 Gy
Session Number: 2

## 2021-12-08 ENCOUNTER — Other Ambulatory Visit: Payer: Self-pay

## 2021-12-08 ENCOUNTER — Ambulatory Visit
Admission: RE | Admit: 2021-12-08 | Discharge: 2021-12-08 | Disposition: A | Discharge status: Home / Self Care | Payer: 59 | Source: Ambulatory Visit | Attending: Radiation Oncology | Admitting: Radiation Oncology

## 2021-12-08 DIAGNOSIS — Z510 Encounter for antineoplastic radiation therapy: Secondary | ICD-10-CM | POA: Diagnosis not present

## 2021-12-08 LAB — RAD ONC ARIA SESSION SUMMARY
Course Elapsed Days: 4
Plan Fractions Treated to Date: 3
Plan Prescribed Dose Per Fraction: 2.66 Gy
Plan Total Fractions Prescribed: 16
Plan Total Prescribed Dose: 42.56 Gy
Reference Point Dosage Given to Date: 7.98 Gy
Reference Point Session Dosage Given: 2.66 Gy
Session Number: 3

## 2021-12-09 ENCOUNTER — Other Ambulatory Visit (HOSPITAL_BASED_OUTPATIENT_CLINIC_OR_DEPARTMENT_OTHER): Payer: Self-pay | Admitting: Obstetrics & Gynecology

## 2021-12-09 ENCOUNTER — Other Ambulatory Visit: Payer: Self-pay

## 2021-12-09 ENCOUNTER — Ambulatory Visit
Admission: RE | Admit: 2021-12-09 | Discharge: 2021-12-09 | Disposition: A | Discharge status: Home / Self Care | Payer: 59 | Source: Ambulatory Visit | Attending: Radiation Oncology | Admitting: Radiation Oncology

## 2021-12-09 ENCOUNTER — Other Ambulatory Visit: Payer: Self-pay | Admitting: Hematology and Oncology

## 2021-12-09 ENCOUNTER — Ambulatory Visit: Payer: No Typology Code available for payment source

## 2021-12-09 ENCOUNTER — Encounter (HOSPITAL_BASED_OUTPATIENT_CLINIC_OR_DEPARTMENT_OTHER): Payer: No Typology Code available for payment source | Admitting: Obstetrics & Gynecology

## 2021-12-09 ENCOUNTER — Telehealth: Payer: Self-pay | Admitting: *Deleted

## 2021-12-09 DIAGNOSIS — D500 Iron deficiency anemia secondary to blood loss (chronic): Secondary | ICD-10-CM

## 2021-12-09 DIAGNOSIS — Z510 Encounter for antineoplastic radiation therapy: Secondary | ICD-10-CM | POA: Diagnosis not present

## 2021-12-09 LAB — RAD ONC ARIA SESSION SUMMARY
Course Elapsed Days: 5
Plan Fractions Treated to Date: 4
Plan Prescribed Dose Per Fraction: 2.66 Gy
Plan Total Fractions Prescribed: 16
Plan Total Prescribed Dose: 42.56 Gy
Reference Point Dosage Given to Date: 10.64 Gy
Reference Point Session Dosage Given: 2.66 Gy
Session Number: 4

## 2021-12-09 NOTE — Progress Notes (Signed)
Venofer plan placed. ?

## 2021-12-09 NOTE — Telephone Encounter (Signed)
This RN spoke with pt per need for IV iron- ( per contact from [facility] ) - with pt stating agreement to obtaining. ? ?Medication procedure and potential side effects discussed. ? ?This RN will send scheduling request for appointments. ? ?MD will enter orders. ? ? ?

## 2021-12-10 ENCOUNTER — Other Ambulatory Visit: Payer: Self-pay

## 2021-12-10 ENCOUNTER — Telehealth: Payer: Self-pay | Admitting: Oncology

## 2021-12-10 ENCOUNTER — Ambulatory Visit
Admission: RE | Admit: 2021-12-10 | Discharge: 2021-12-10 | Disposition: A | Discharge status: Home / Self Care | Payer: 59 | Source: Ambulatory Visit | Attending: Radiation Oncology | Admitting: Radiation Oncology

## 2021-12-10 DIAGNOSIS — Z510 Encounter for antineoplastic radiation therapy: Secondary | ICD-10-CM | POA: Diagnosis not present

## 2021-12-10 LAB — RAD ONC ARIA SESSION SUMMARY
Course Elapsed Days: 6
Plan Fractions Treated to Date: 5
Plan Prescribed Dose Per Fraction: 2.66 Gy
Plan Total Fractions Prescribed: 16
Plan Total Prescribed Dose: 42.56 Gy
Reference Point Dosage Given to Date: 13.3 Gy
Reference Point Session Dosage Given: 2.66 Gy
Session Number: 5

## 2021-12-10 NOTE — Telephone Encounter (Signed)
Per 4/25 in basket called and spoke to pt about the added infusions after her treatments.  Pr confirmed appointments  ?

## 2021-12-11 ENCOUNTER — Ambulatory Visit
Admission: RE | Admit: 2021-12-11 | Discharge: 2021-12-11 | Disposition: A | Discharge status: Home / Self Care | Payer: 59 | Source: Ambulatory Visit | Attending: Radiation Oncology | Admitting: Radiation Oncology

## 2021-12-11 ENCOUNTER — Other Ambulatory Visit: Payer: Self-pay

## 2021-12-11 DIAGNOSIS — Z510 Encounter for antineoplastic radiation therapy: Secondary | ICD-10-CM | POA: Diagnosis not present

## 2021-12-11 LAB — RAD ONC ARIA SESSION SUMMARY
Course Elapsed Days: 7
Plan Fractions Treated to Date: 6
Plan Prescribed Dose Per Fraction: 2.66 Gy
Plan Total Fractions Prescribed: 16
Plan Total Prescribed Dose: 42.56 Gy
Reference Point Dosage Given to Date: 15.96 Gy
Reference Point Session Dosage Given: 2.66 Gy
Session Number: 6

## 2021-12-12 ENCOUNTER — Other Ambulatory Visit: Payer: Self-pay

## 2021-12-12 ENCOUNTER — Ambulatory Visit
Admission: RE | Admit: 2021-12-12 | Discharge: 2021-12-12 | Disposition: A | Discharge status: Home / Self Care | Payer: 59 | Source: Ambulatory Visit | Attending: Radiation Oncology | Admitting: Radiation Oncology

## 2021-12-12 DIAGNOSIS — Z510 Encounter for antineoplastic radiation therapy: Secondary | ICD-10-CM | POA: Diagnosis not present

## 2021-12-12 LAB — RAD ONC ARIA SESSION SUMMARY
Course Elapsed Days: 8
Plan Fractions Treated to Date: 7
Plan Prescribed Dose Per Fraction: 2.66 Gy
Plan Total Fractions Prescribed: 16
Plan Total Prescribed Dose: 42.56 Gy
Reference Point Dosage Given to Date: 18.62 Gy
Reference Point Session Dosage Given: 2.66 Gy
Session Number: 7

## 2021-12-15 ENCOUNTER — Ambulatory Visit
Admission: RE | Admit: 2021-12-15 | Discharge: 2021-12-15 | Disposition: A | Discharge status: Home / Self Care | Payer: 59 | Source: Ambulatory Visit | Attending: Radiation Oncology | Admitting: Radiation Oncology

## 2021-12-15 ENCOUNTER — Ambulatory Visit: Payer: No Typology Code available for payment source

## 2021-12-15 ENCOUNTER — Inpatient Hospital Stay: Payer: 59 | Attending: Genetic Counselor

## 2021-12-15 ENCOUNTER — Other Ambulatory Visit: Payer: Self-pay

## 2021-12-15 VITALS — BP 128/78 | HR 84 | Resp 16

## 2021-12-15 DIAGNOSIS — Z510 Encounter for antineoplastic radiation therapy: Secondary | ICD-10-CM | POA: Insufficient documentation

## 2021-12-15 DIAGNOSIS — D0511 Intraductal carcinoma in situ of right breast: Secondary | ICD-10-CM | POA: Insufficient documentation

## 2021-12-15 DIAGNOSIS — D500 Iron deficiency anemia secondary to blood loss (chronic): Secondary | ICD-10-CM | POA: Diagnosis not present

## 2021-12-15 DIAGNOSIS — N920 Excessive and frequent menstruation with regular cycle: Secondary | ICD-10-CM | POA: Diagnosis present

## 2021-12-15 LAB — RAD ONC ARIA SESSION SUMMARY
Course Elapsed Days: 11
Plan Fractions Treated to Date: 8
Plan Prescribed Dose Per Fraction: 2.66 Gy
Plan Total Fractions Prescribed: 16
Plan Total Prescribed Dose: 42.56 Gy
Reference Point Dosage Given to Date: 21.28 Gy
Reference Point Session Dosage Given: 2.66 Gy
Session Number: 8

## 2021-12-15 MED ORDER — SODIUM CHLORIDE 0.9 % IV SOLN
200.0000 mg | Freq: Once | INTRAVENOUS | Status: AC
  Administered 2021-12-15: 200 mg via INTRAVENOUS
  Filled 2021-12-15: qty 200

## 2021-12-15 MED ORDER — SODIUM CHLORIDE 0.9 % IV SOLN: Freq: Once | INTRAVENOUS | Status: AC

## 2021-12-15 NOTE — Patient Instructions (Signed)

## 2021-12-16 ENCOUNTER — Other Ambulatory Visit: Payer: Self-pay

## 2021-12-16 ENCOUNTER — Ambulatory Visit
Admission: RE | Admit: 2021-12-16 | Discharge: 2021-12-16 | Disposition: A | Discharge status: Home / Self Care | Payer: 59 | Source: Ambulatory Visit | Attending: Radiation Oncology | Admitting: Radiation Oncology

## 2021-12-16 DIAGNOSIS — Z510 Encounter for antineoplastic radiation therapy: Secondary | ICD-10-CM | POA: Diagnosis not present

## 2021-12-16 LAB — RAD ONC ARIA SESSION SUMMARY
Course Elapsed Days: 12
Plan Fractions Treated to Date: 9
Plan Prescribed Dose Per Fraction: 2.66 Gy
Plan Total Fractions Prescribed: 16
Plan Total Prescribed Dose: 42.56 Gy
Reference Point Dosage Given to Date: 23.94 Gy
Reference Point Session Dosage Given: 2.66 Gy
Session Number: 9

## 2021-12-17 ENCOUNTER — Ambulatory Visit
Admission: RE | Admit: 2021-12-17 | Discharge: 2021-12-17 | Disposition: A | Discharge status: Home / Self Care | Payer: 59 | Source: Ambulatory Visit | Attending: Radiation Oncology | Admitting: Radiation Oncology

## 2021-12-17 ENCOUNTER — Ambulatory Visit: Payer: 59

## 2021-12-17 ENCOUNTER — Other Ambulatory Visit: Payer: Self-pay

## 2021-12-17 DIAGNOSIS — Z510 Encounter for antineoplastic radiation therapy: Secondary | ICD-10-CM | POA: Diagnosis not present

## 2021-12-17 LAB — RAD ONC ARIA SESSION SUMMARY
Course Elapsed Days: 13
Plan Fractions Treated to Date: 10
Plan Prescribed Dose Per Fraction: 2.66 Gy
Plan Total Fractions Prescribed: 16
Plan Total Prescribed Dose: 42.56 Gy
Reference Point Dosage Given to Date: 26.6 Gy
Reference Point Session Dosage Given: 2.66 Gy
Session Number: 10

## 2021-12-18 ENCOUNTER — Encounter: Payer: No Typology Code available for payment source | Admitting: Genetic Counselor

## 2021-12-18 ENCOUNTER — Ambulatory Visit
Admission: RE | Admit: 2021-12-18 | Discharge: 2021-12-18 | Disposition: A | Discharge status: Home / Self Care | Payer: 59 | Source: Ambulatory Visit | Attending: Radiation Oncology | Admitting: Radiation Oncology

## 2021-12-18 ENCOUNTER — Other Ambulatory Visit: Payer: Self-pay

## 2021-12-18 ENCOUNTER — Other Ambulatory Visit: Payer: No Typology Code available for payment source

## 2021-12-18 DIAGNOSIS — Z510 Encounter for antineoplastic radiation therapy: Secondary | ICD-10-CM | POA: Diagnosis not present

## 2021-12-18 LAB — RAD ONC ARIA SESSION SUMMARY
Course Elapsed Days: 14
Plan Fractions Treated to Date: 11
Plan Prescribed Dose Per Fraction: 2.66 Gy
Plan Total Fractions Prescribed: 16
Plan Total Prescribed Dose: 42.56 Gy
Reference Point Dosage Given to Date: 29.26 Gy
Reference Point Session Dosage Given: 2.66 Gy
Session Number: 11

## 2021-12-19 ENCOUNTER — Ambulatory Visit
Admission: RE | Admit: 2021-12-19 | Discharge: 2021-12-19 | Disposition: A | Discharge status: Home / Self Care | Payer: 59 | Source: Ambulatory Visit | Attending: Radiation Oncology | Admitting: Radiation Oncology

## 2021-12-19 ENCOUNTER — Other Ambulatory Visit: Payer: Self-pay

## 2021-12-19 DIAGNOSIS — Z510 Encounter for antineoplastic radiation therapy: Secondary | ICD-10-CM | POA: Diagnosis not present

## 2021-12-19 LAB — RAD ONC ARIA SESSION SUMMARY
Course Elapsed Days: 15
Plan Fractions Treated to Date: 12
Plan Prescribed Dose Per Fraction: 2.66 Gy
Plan Total Fractions Prescribed: 16
Plan Total Prescribed Dose: 42.56 Gy
Reference Point Dosage Given to Date: 31.92 Gy
Reference Point Session Dosage Given: 2.66 Gy
Session Number: 12

## 2021-12-22 ENCOUNTER — Other Ambulatory Visit: Payer: Self-pay

## 2021-12-22 ENCOUNTER — Ambulatory Visit
Admission: RE | Admit: 2021-12-22 | Discharge: 2021-12-22 | Disposition: A | Discharge status: Home / Self Care | Payer: 59 | Source: Ambulatory Visit | Attending: Radiation Oncology | Admitting: Radiation Oncology

## 2021-12-22 ENCOUNTER — Inpatient Hospital Stay: Payer: 59

## 2021-12-22 VITALS — BP 114/78 | HR 78 | Temp 98.3°F | Resp 17

## 2021-12-22 DIAGNOSIS — Z510 Encounter for antineoplastic radiation therapy: Secondary | ICD-10-CM | POA: Diagnosis not present

## 2021-12-22 DIAGNOSIS — D0511 Intraductal carcinoma in situ of right breast: Secondary | ICD-10-CM

## 2021-12-22 LAB — RAD ONC ARIA SESSION SUMMARY
Course Elapsed Days: 18
Plan Fractions Treated to Date: 13
Plan Prescribed Dose Per Fraction: 2.66 Gy
Plan Total Fractions Prescribed: 16
Plan Total Prescribed Dose: 42.56 Gy
Reference Point Dosage Given to Date: 34.58 Gy
Reference Point Session Dosage Given: 2.66 Gy
Session Number: 13

## 2021-12-22 MED ORDER — SODIUM CHLORIDE 0.9 % IV SOLN
200.0000 mg | Freq: Once | INTRAVENOUS | Status: AC
  Administered 2021-12-22: 200 mg via INTRAVENOUS
  Filled 2021-12-22: qty 200

## 2021-12-22 MED ORDER — SODIUM CHLORIDE 0.9 % IV SOLN: Freq: Once | INTRAVENOUS | Status: AC

## 2021-12-22 NOTE — Patient Instructions (Signed)

## 2021-12-23 ENCOUNTER — Ambulatory Visit
Admission: RE | Admit: 2021-12-23 | Discharge: 2021-12-23 | Disposition: A | Discharge status: Home / Self Care | Payer: 59 | Source: Ambulatory Visit | Attending: Radiation Oncology | Admitting: Radiation Oncology

## 2021-12-23 ENCOUNTER — Other Ambulatory Visit: Payer: Self-pay

## 2021-12-23 DIAGNOSIS — Z510 Encounter for antineoplastic radiation therapy: Secondary | ICD-10-CM | POA: Diagnosis not present

## 2021-12-23 LAB — RAD ONC ARIA SESSION SUMMARY
Course Elapsed Days: 19
Plan Fractions Treated to Date: 14
Plan Prescribed Dose Per Fraction: 2.66 Gy
Plan Total Fractions Prescribed: 16
Plan Total Prescribed Dose: 42.56 Gy
Reference Point Dosage Given to Date: 37.24 Gy
Reference Point Session Dosage Given: 2.66 Gy
Session Number: 14

## 2021-12-24 ENCOUNTER — Ambulatory Visit
Admission: RE | Admit: 2021-12-24 | Discharge: 2021-12-24 | Disposition: A | Discharge status: Home / Self Care | Payer: 59 | Source: Ambulatory Visit | Attending: Radiation Oncology | Admitting: Radiation Oncology

## 2021-12-24 ENCOUNTER — Other Ambulatory Visit: Payer: Self-pay

## 2021-12-24 DIAGNOSIS — Z510 Encounter for antineoplastic radiation therapy: Secondary | ICD-10-CM | POA: Diagnosis not present

## 2021-12-24 LAB — RAD ONC ARIA SESSION SUMMARY
Course Elapsed Days: 20
Plan Fractions Treated to Date: 15
Plan Prescribed Dose Per Fraction: 2.66 Gy
Plan Total Fractions Prescribed: 16
Plan Total Prescribed Dose: 42.56 Gy
Reference Point Dosage Given to Date: 39.9 Gy
Reference Point Session Dosage Given: 2.66 Gy
Session Number: 15

## 2021-12-25 ENCOUNTER — Ambulatory Visit
Admission: RE | Admit: 2021-12-25 | Discharge: 2021-12-25 | Disposition: A | Discharge status: Home / Self Care | Payer: 59 | Source: Ambulatory Visit | Attending: Radiation Oncology | Admitting: Radiation Oncology

## 2021-12-25 ENCOUNTER — Other Ambulatory Visit: Payer: Self-pay

## 2021-12-25 DIAGNOSIS — Z510 Encounter for antineoplastic radiation therapy: Secondary | ICD-10-CM | POA: Diagnosis not present

## 2021-12-25 LAB — RAD ONC ARIA SESSION SUMMARY
Course Elapsed Days: 21
Plan Fractions Treated to Date: 16
Plan Prescribed Dose Per Fraction: 2.66 Gy
Plan Total Fractions Prescribed: 16
Plan Total Prescribed Dose: 42.56 Gy
Reference Point Dosage Given to Date: 42.56 Gy
Reference Point Session Dosage Given: 2.66 Gy
Session Number: 16

## 2021-12-26 ENCOUNTER — Other Ambulatory Visit: Payer: Self-pay

## 2021-12-26 ENCOUNTER — Ambulatory Visit
Admission: RE | Admit: 2021-12-26 | Discharge: 2021-12-26 | Disposition: A | Discharge status: Home / Self Care | Payer: 59 | Source: Ambulatory Visit | Attending: Radiation Oncology | Admitting: Radiation Oncology

## 2021-12-26 DIAGNOSIS — Z510 Encounter for antineoplastic radiation therapy: Secondary | ICD-10-CM | POA: Diagnosis not present

## 2021-12-26 LAB — RAD ONC ARIA SESSION SUMMARY
Course Elapsed Days: 22
Plan Fractions Treated to Date: 1
Plan Prescribed Dose Per Fraction: 2 Gy
Plan Total Fractions Prescribed: 5
Plan Total Prescribed Dose: 10 Gy
Reference Point Dosage Given to Date: 44.56 Gy
Reference Point Session Dosage Given: 2 Gy
Session Number: 17

## 2021-12-29 ENCOUNTER — Other Ambulatory Visit: Payer: Self-pay

## 2021-12-29 ENCOUNTER — Ambulatory Visit
Admission: RE | Admit: 2021-12-29 | Discharge: 2021-12-29 | Disposition: A | Discharge status: Home / Self Care | Payer: 59 | Source: Ambulatory Visit | Attending: Radiation Oncology | Admitting: Radiation Oncology

## 2021-12-29 ENCOUNTER — Other Ambulatory Visit (HOSPITAL_BASED_OUTPATIENT_CLINIC_OR_DEPARTMENT_OTHER): Payer: 59

## 2021-12-29 ENCOUNTER — Inpatient Hospital Stay: Payer: 59

## 2021-12-29 VITALS — BP 120/80 | HR 75 | Temp 98.0°F | Resp 18 | Ht 61.0 in | Wt 123.8 lb

## 2021-12-29 DIAGNOSIS — D500 Iron deficiency anemia secondary to blood loss (chronic): Secondary | ICD-10-CM

## 2021-12-29 DIAGNOSIS — D0511 Intraductal carcinoma in situ of right breast: Secondary | ICD-10-CM

## 2021-12-29 DIAGNOSIS — Z510 Encounter for antineoplastic radiation therapy: Secondary | ICD-10-CM | POA: Diagnosis not present

## 2021-12-29 LAB — RAD ONC ARIA SESSION SUMMARY
Course Elapsed Days: 25
Plan Fractions Treated to Date: 2
Plan Prescribed Dose Per Fraction: 2 Gy
Plan Total Fractions Prescribed: 5
Plan Total Prescribed Dose: 10 Gy
Reference Point Dosage Given to Date: 46.56 Gy
Reference Point Session Dosage Given: 2 Gy
Session Number: 18

## 2021-12-29 MED ORDER — SODIUM CHLORIDE 0.9 % IV SOLN: Freq: Once | INTRAVENOUS | Status: AC

## 2021-12-29 MED ORDER — SODIUM CHLORIDE 0.9 % IV SOLN
200.0000 mg | Freq: Once | INTRAVENOUS | Status: AC
  Administered 2021-12-29: 200 mg via INTRAVENOUS
  Filled 2021-12-29: qty 200

## 2021-12-29 NOTE — Patient Instructions (Signed)

## 2021-12-29 NOTE — Progress Notes (Signed)
Patient declined to stay for 30 minute observation post iron infusion. VSS, patient ambulatory at discharge from infusion room.  ?

## 2021-12-30 ENCOUNTER — Ambulatory Visit
Admission: RE | Admit: 2021-12-30 | Discharge: 2021-12-30 | Disposition: A | Discharge status: Home / Self Care | Payer: 59 | Source: Ambulatory Visit | Attending: Radiation Oncology | Admitting: Radiation Oncology

## 2021-12-30 ENCOUNTER — Other Ambulatory Visit: Payer: Self-pay

## 2021-12-30 DIAGNOSIS — Z510 Encounter for antineoplastic radiation therapy: Secondary | ICD-10-CM | POA: Diagnosis not present

## 2021-12-30 LAB — RAD ONC ARIA SESSION SUMMARY
Course Elapsed Days: 26
Plan Fractions Treated to Date: 3
Plan Prescribed Dose Per Fraction: 2 Gy
Plan Total Fractions Prescribed: 5
Plan Total Prescribed Dose: 10 Gy
Reference Point Dosage Given to Date: 48.56 Gy
Reference Point Session Dosage Given: 2 Gy
Session Number: 19

## 2021-12-30 LAB — HEMOGLOBIN: Hemoglobin: 8.9 g/dL — ABNORMAL LOW (ref 11.1–15.9)

## 2021-12-31 ENCOUNTER — Ambulatory Visit
Admission: RE | Admit: 2021-12-31 | Discharge: 2021-12-31 | Disposition: A | Discharge status: Home / Self Care | Payer: 59 | Source: Ambulatory Visit | Attending: Radiation Oncology | Admitting: Radiation Oncology

## 2021-12-31 ENCOUNTER — Other Ambulatory Visit: Payer: Self-pay

## 2021-12-31 ENCOUNTER — Encounter: Payer: Self-pay | Admitting: Hematology and Oncology

## 2021-12-31 ENCOUNTER — Encounter (HOSPITAL_BASED_OUTPATIENT_CLINIC_OR_DEPARTMENT_OTHER): Payer: Self-pay | Admitting: Obstetrics & Gynecology

## 2021-12-31 DIAGNOSIS — Z510 Encounter for antineoplastic radiation therapy: Secondary | ICD-10-CM | POA: Diagnosis not present

## 2021-12-31 LAB — RAD ONC ARIA SESSION SUMMARY
Course Elapsed Days: 27
Plan Fractions Treated to Date: 4
Plan Prescribed Dose Per Fraction: 2 Gy
Plan Total Fractions Prescribed: 5
Plan Total Prescribed Dose: 10 Gy
Reference Point Dosage Given to Date: 50.56 Gy
Reference Point Session Dosage Given: 2 Gy
Session Number: 20

## 2021-12-31 NOTE — Progress Notes (Signed)
Spoke w/ via phone for pre-op interview---Frances Craig ?Lab needs dos---- urine pregnancy POCT              ?Lab results------01/05/22 lab appt for CBC, BMP, type & screen ?COVID test -----patient states asymptomatic no test needed ?Arrive at -------0530 on Wednesday, 01/07/22 ?NPO after MN NO Solid Food.  Clear liquids from MN until---0430 ?Med rec completed ?Medications to take morning of surgery -----Flonase ?Diabetic medication -----n/a ?Patient instructed no nail polish to be worn day of surgery ?Patient instructed to bring photo id and insurance card day of surgery ?Patient aware to have Driver (ride ) / caregiver    for 24 hours after surgery - husband, Coralyn Mark ?Patient Special Instructions -----Extended / overnight stay instructions given. ?Pre-Op special Istructions -----Patient stated she would wear her glasses and leave contact lenses at home on day of surgery. ?Patient verbalized understanding of instructions that were given at this phone interview. ?Patient denies shortness of breath, chest pain, fever, cough at this phone interview.  ?

## 2021-12-31 NOTE — Progress Notes (Signed)
? ? Your procedure is scheduled on Wednesday, 01/07/22. ? Report to [place] ? Call this number if you have problems the morning of surgery  [phone]. ? ? [place].  WE ARE LOCATED IN THE [facility]. ? ?PLEASE BRING YOUR INSURANCE CARD AND PHOTO ID DAY OF SURGERY. ? ?ONLY 2 PEOPLE ARE ALLOWED IN  WAITING  ROOM.  ?                                   ? REMEMBER: ? DO NOT EAT FOOD, CANDY GUM OR MINTS  AFTER MIDNIGHT THE NIGHT BEFORE YOUR SURGERY . YOU MAY HAVE CLEAR LIQUIDS FROM MIDNIGHT THE NIGHT BEFORE YOUR SURGERY UNTIL  4:30 AM. NO CLEAR LIQUIDS AFTER   4:30 AM DAY OF SURGERY. ? ?YOU MAY  BRUSH YOUR TEETH MORNING OF SURGERY AND RINSE YOUR MOUTH OUT, NO CHEWING GUM CANDY OR MINTS. ? ? ? ? ?CLEAR LIQUID DIET ? ? ?Foods Allowed                                                                     Foods Excluded ? ?Coffee and tea, regular and decaf                             liquids that you cannot  ?Plain Jell-O                                                                   see through such as: ?Fruit ices (not with fruit pulp)                                     milk, soups, orange juice  ?Plain  Popsicles                                    All solid food ?Carbonated beverages, regular and diet                                    ?Cranberry, grape and apple juices ?Sports drinks like Gatorade ?_____________________________________________________________________ ?  ? ? TAKE THESE MEDICATIONS MORNING OF SURGERY: Flonase ? ? ? UP TO 4 VISITORS  MAY VISIT IN THE EXTENDED RECOVERY ROOM UNTIL 800 PM ONLY.  1 VISITOR AGE 45 AND OVER MAY SPEND THE NIGHT AND MUST BE IN EXTENDED RECOVERY ROOM NO LATER THAN 800 PM . YOUR DISCHARGE TIME AFTER YOU SPEND THE NIGHT IS 900 AM THE MORNING AFTER YOUR SURGERY. ? ?YOU MAY PACK A SMALL OVERNIGHT BAG WITH TOILETRIES FOR YOUR OVERNIGHT STAY IF YOU  WISH. ? ?YOUR PRESCRIPTION MEDICATIONS WILL BE PROVIDED  DURING [place]. ? ? ?                                   ?DO NOT WEAR JEWERLY, MAKE UP. ?DO NOT WEAR LOTIONS, POWDERS, PERFUMES OR NAIL POLISH ON YOUR FINGERNAILS. TOENAIL POLISH IS OK TO WEAR. ?DO NOT SHAVE FOR 48 HOURS PRIOR TO DAY OF SURGERY. ?MEN MAY SHAVE FACE AND NECK. ?CONTACTS, GLASSES, OR DENTURES MAY NOT BE WORN TO SURGERY. ? ?REMEMBER: NO SMOKING, DRUGS OR ALCOHOL FOR 24 HOURS BEFORE YOUR SURGERY. ?                                   ?[facility] IS NOT RESPONSIBLE  FOR ANY BELONGINGS.                                  ?                                  . ?          [facility] - Preparing for Surgery ?Before surgery, you can play an important role.  Because skin is not sterile, your skin needs to be as free of germs as possible.  You can reduce the number of germs on your skin by washing with CHG (chlorahexidine gluconate) soap before surgery.  CHG is an antiseptic cleaner which kills germs and bonds with the skin to continue killing germs even after washing. ?Please DO NOT use if you have an allergy to CHG or antibacterial soaps.  If your skin becomes reddened/irritated stop using the CHG and inform your nurse when you arrive at Short Stay. ?Do not shave (including legs and underarms) for at least 48 hours prior to the first CHG shower.  You may shave your face/neck. ?Please follow these instructions carefully: ? 1.  Shower with CHG Soap the night before surgery and the  morning of Surgery. ? 2.  If you choose to wash your hair, wash your hair first as usual with your  normal  shampoo. ? 3.  After you shampoo, rinse your hair and body thoroughly to remove the  shampoo.                            ?4.  Use CHG as you would any other liquid soap.  You can apply chg directly  to the skin and wash , please wash your belly button thoroughly with chg soap provided night before and morning of your surgery. ?                    Gently with a scrungie or clean washcloth. ? 5.  Apply the CHG Soap to your  body ONLY FROM THE NECK DOWN.   Do not use on face/ open      ?                     Wound or open sores. Avoid contact with eyes, ears mouth and genitals (private parts).  ?  Wash face,  Genitals (private parts) with your normal soap. ?            6.  Wash thoroughly, paying special attention to the area where your surgery  will be performed. ? 7.  Thoroughly rinse your body with warm water from the neck down. ? 8.  DO NOT shower/wash with your normal soap after using and rinsing off  the CHG Soap. ?               9.  Pat yourself dry with a clean towel. ?           10.  Wear clean pajamas. ?           11.  Place clean sheets on your bed the night of your first shower and do not  sleep with pets. ?Day of Surgery : ?Do not apply any lotions/deodorants the morning of surgery.  Please wear clean clothes to the hospital/surgery center. ? ?IF YOU HAVE ANY SKIN IRRITATION OR PROBLEMS WITH THE SURGICAL SOAP, PLEASE GET A BAR OF GOLD DIAL SOAP AND SHOWER THE NIGHT BEFORE YOUR SURGERY AND THE MORNING OF YOUR SURGERY. PLEASE LET THE NURSE KNOW MORNING OF YOUR SURGERY IF YOU HAD ANY PROBLEMS WITH THE SURGICAL SOAP. ? ? ?________________________________________________________________________                  ?                                    ?  QUESTIONS CALL Hayzel Ruberg PRE OP NURSE PHONE [phone].                                    ?

## 2022-01-01 ENCOUNTER — Encounter: Payer: Self-pay | Admitting: *Deleted

## 2022-01-01 ENCOUNTER — Other Ambulatory Visit: Payer: Self-pay

## 2022-01-01 ENCOUNTER — Encounter (HOSPITAL_BASED_OUTPATIENT_CLINIC_OR_DEPARTMENT_OTHER): Payer: No Typology Code available for payment source | Admitting: Obstetrics & Gynecology

## 2022-01-01 ENCOUNTER — Ambulatory Visit
Admission: RE | Admit: 2022-01-01 | Discharge: 2022-01-01 | Disposition: A | Discharge status: Home / Self Care | Payer: 59 | Source: Ambulatory Visit | Attending: Radiation Oncology | Admitting: Radiation Oncology

## 2022-01-01 DIAGNOSIS — Z510 Encounter for antineoplastic radiation therapy: Secondary | ICD-10-CM | POA: Diagnosis not present

## 2022-01-01 DIAGNOSIS — D0511 Intraductal carcinoma in situ of right breast: Secondary | ICD-10-CM

## 2022-01-01 LAB — RAD ONC ARIA SESSION SUMMARY
Course Elapsed Days: 28
Plan Fractions Treated to Date: 5
Plan Prescribed Dose Per Fraction: 2 Gy
Plan Total Fractions Prescribed: 5
Plan Total Prescribed Dose: 10 Gy
Reference Point Dosage Given to Date: 52.56 Gy
Reference Point Session Dosage Given: 2 Gy
Session Number: 21

## 2022-01-05 ENCOUNTER — Encounter (HOSPITAL_COMMUNITY): Payer: 59

## 2022-01-07 ENCOUNTER — Other Ambulatory Visit: Payer: Self-pay

## 2022-01-07 ENCOUNTER — Ambulatory Visit (HOSPITAL_BASED_OUTPATIENT_CLINIC_OR_DEPARTMENT_OTHER): Admission: RE | Admit: 2022-01-07 | Payer: 59 | Source: Home / Self Care | Admitting: Obstetrics & Gynecology

## 2022-01-07 ENCOUNTER — Telehealth: Payer: Self-pay | Admitting: Hematology and Oncology

## 2022-01-07 ENCOUNTER — Other Ambulatory Visit: Payer: Self-pay | Admitting: Hematology and Oncology

## 2022-01-07 ENCOUNTER — Encounter (HOSPITAL_BASED_OUTPATIENT_CLINIC_OR_DEPARTMENT_OTHER): Payer: Self-pay | Admitting: Obstetrics & Gynecology

## 2022-01-07 DIAGNOSIS — N63 Unspecified lump in unspecified breast: Secondary | ICD-10-CM

## 2022-01-07 DIAGNOSIS — D649 Anemia, unspecified: Secondary | ICD-10-CM

## 2022-01-07 DIAGNOSIS — Z01818 Encounter for other preprocedural examination: Secondary | ICD-10-CM

## 2022-01-07 DIAGNOSIS — D0511 Intraductal carcinoma in situ of right breast: Secondary | ICD-10-CM

## 2022-01-07 HISTORY — DX: Presence of spectacles and contact lenses: Z97.3

## 2022-01-07 HISTORY — DX: Excessive and frequent menstruation with regular cycle: N92.0

## 2022-01-07 HISTORY — DX: COVID-19: U07.1

## 2022-01-07 HISTORY — DX: Personal history of irradiation: Z92.3

## 2022-01-07 SURGERY — HYSTERECTOMY, TOTAL, LAPAROSCOPIC, WITH SALPINGECTOMY
Anesthesia: Choice

## 2022-01-07 NOTE — Telephone Encounter (Signed)
Scheduled appointment per 05/22 los. Patient aware.  

## 2022-01-07 NOTE — Progress Notes (Signed)
Iron panel and ferritin added

## 2022-01-08 ENCOUNTER — Other Ambulatory Visit: Payer: Self-pay

## 2022-01-08 ENCOUNTER — Inpatient Hospital Stay (HOSPITAL_BASED_OUTPATIENT_CLINIC_OR_DEPARTMENT_OTHER): Payer: 59 | Admitting: Hematology and Oncology

## 2022-01-08 ENCOUNTER — Inpatient Hospital Stay: Payer: 59

## 2022-01-08 DIAGNOSIS — D500 Iron deficiency anemia secondary to blood loss (chronic): Secondary | ICD-10-CM | POA: Diagnosis not present

## 2022-01-08 DIAGNOSIS — N63 Unspecified lump in unspecified breast: Secondary | ICD-10-CM

## 2022-01-08 DIAGNOSIS — D649 Anemia, unspecified: Secondary | ICD-10-CM

## 2022-01-08 DIAGNOSIS — D0511 Intraductal carcinoma in situ of right breast: Secondary | ICD-10-CM | POA: Diagnosis not present

## 2022-01-08 DIAGNOSIS — Z510 Encounter for antineoplastic radiation therapy: Secondary | ICD-10-CM | POA: Diagnosis not present

## 2022-01-08 LAB — CBC WITH DIFFERENTIAL (CANCER CENTER ONLY)
Abs Immature Granulocytes: 0.02 10*3/uL (ref 0.00–0.07)
Basophils Absolute: 0.1 10*3/uL (ref 0.0–0.1)
Basophils Relative: 1 %
Eosinophils Absolute: 0.2 10*3/uL (ref 0.0–0.5)
Eosinophils Relative: 3 %
HCT: 30.1 % — ABNORMAL LOW (ref 36.0–46.0)
Hemoglobin: 8.7 g/dL — ABNORMAL LOW (ref 12.0–15.0)
Immature Granulocytes: 0 %
Lymphocytes Relative: 12 %
Lymphs Abs: 0.6 10*3/uL — ABNORMAL LOW (ref 0.7–4.0)
MCH: 22.3 pg — ABNORMAL LOW (ref 26.0–34.0)
MCHC: 28.9 g/dL — ABNORMAL LOW (ref 30.0–36.0)
MCV: 77.2 fL — ABNORMAL LOW (ref 80.0–100.0)
Monocytes Absolute: 0.3 10*3/uL (ref 0.1–1.0)
Monocytes Relative: 5 %
Neutro Abs: 4.3 10*3/uL (ref 1.7–7.7)
Neutrophils Relative %: 79 %
Platelet Count: 401 10*3/uL — ABNORMAL HIGH (ref 150–400)
RBC: 3.9 MIL/uL (ref 3.87–5.11)
RDW: 24 % — ABNORMAL HIGH (ref 11.5–15.5)
WBC Count: 5.4 10*3/uL (ref 4.0–10.5)
nRBC: 0 % (ref 0.0–0.2)

## 2022-01-08 LAB — CMP (CANCER CENTER ONLY)
ALT: 7 U/L (ref 0–44)
AST: 10 U/L — ABNORMAL LOW (ref 15–41)
Albumin: 3.8 g/dL (ref 3.5–5.0)
Alkaline Phosphatase: 43 U/L (ref 38–126)
Anion gap: 5 (ref 5–15)
BUN: 12 mg/dL (ref 6–20)
CO2: 25 mmol/L (ref 22–32)
Calcium: 8.4 mg/dL — ABNORMAL LOW (ref 8.9–10.3)
Chloride: 108 mmol/L (ref 98–111)
Creatinine: 0.81 mg/dL (ref 0.44–1.00)
GFR, Estimated: >60 mL/min (ref >60)
Glucose, Bld: 135 mg/dL — ABNORMAL HIGH (ref 70–99)
Potassium: 3.7 mmol/L (ref 3.5–5.1)
Sodium: 138 mmol/L (ref 135–145)
Total Bilirubin: 0.2 mg/dL — ABNORMAL LOW (ref 0.3–1.2)
Total Protein: 6 g/dL — ABNORMAL LOW (ref 6.5–8.1)

## 2022-01-08 LAB — IRON AND IRON BINDING CAPACITY (CC-WL,HP ONLY)
Iron: 25 ug/dL — ABNORMAL LOW (ref 28–170)
Saturation Ratios: 5 % — ABNORMAL LOW (ref 10.4–31.8)
TIBC: 535 ug/dL — ABNORMAL HIGH (ref 250–450)
UIBC: 510 ug/dL — ABNORMAL HIGH (ref 148–442)

## 2022-01-08 NOTE — Progress Notes (Signed)
[place] CONSULT NOTE  Patient Care Team: Aletha Halim, MD as PCP - General (Obstetrics and Gynecology) Mauro Kaufmann, RN as Oncology Nurse Navigator Rockwell Germany, RN as Oncology Nurse Navigator  CHIEF COMPLAINTS/PURPOSE OF CONSULTATION:  Anemia follow up, History of breast cancer  HISTORY OF PRESENTING ILLNESS:  Frances Craig 46 y.o. female is here because of recent diagnosis of right breast DCIS and LCIS  Patient had a diagnostic mammogram bilaterally which showed multiple abnormal findings in the right breast which showed 2.4 x 1 x 3.7 cm irregular spiculated mass at the 10:30 position 3 cm from the nipple, no abnormal right axillary lymph nodes noted.  A 4 x 3 x 4 mm benign complicated cyst at the 5 o'clock position of the retroareolar breast, 1.2 x 1.7 x 1.0 cm benign simple cyst at the 10 o'clock position 1 cm from the nipple.  No sonographic abnormalities in the upper outer left breast.  Ultrasound-guided right breast biopsy was scheduled Ultrasound of the breast and biopsy showed complex sclerosing lesion with lobular neoplasia and calcifications, atypical lobular hyperplasia. 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  SUMMARY OF ONCOLOGIC HISTORY: Oncology History  Ductal carcinoma in situ (DCIS) of right breast  11/20/2021 Initial Diagnosis   Ductal carcinoma in situ (DCIS) of right breast   12/02/2021 Genetic Testing    Negative genetic testing on the CancerNext-Expanded+RNAinsight panel.  The report date is 12/02/2021.  The CancerNext-Expanded gene panel offered by [facility] and includes sequencing and rearrangement analysis for the following 77 genes: AIP, ALK, APC*, ATM*, AXIN2, BAP1, BARD1, BLM, BMPR1A, BRCA1*, BRCA2*, BRIP1*, CDC73, CDH1*, CDK4, CDKN1B, CDKN2A, CHEK2*, CTNNA1, DICER1, FANCC, FH, FLCN, GALNT12, KIF1B, LZTR1, MAX, MEN1, MET, MLH1*, MSH2*, MSH3, MSH6*, MUTYH*, NBN, NF1*, NF2, NTHL1, PALB2*, PHOX2B, PMS2*, POT1, PRKAR1A, PTCH1, PTEN*, RAD51C*, RAD51D*, RB1, RECQL, RET, SDHA, SDHAF2, SDHB, SDHC, SDHD, SMAD4, SMARCA4, SMARCB1, SMARCE1, STK11, SUFU, TMEM127, TP53*, TSC1, TSC2, VHL and XRCC2 (sequencing and deletion/duplication); EGFR, EGLN1, HOXB13, KIT, MITF, PDGFRA, POLD1, and POLE (sequencing only); EPCAM and GREM1 (deletion/duplication only). DNA and RNA analyses performed for * genes.     She is here for follow-up on iron deficiency anemia since her hysterectomy was canceled.  She got 3 doses of Venofer but there appears to be no significant improvement in hemoglobin, last hemoglobin of 8.7.  She is hoping to have this rescheduled since she continues to have a constant period. She is also taking oral iron every other day with a glass of orange juice.  She just completed radiation on 01/01/2022 and has tolerated it well.  She tells me that she is not very symptomatic from the iron deficiency anemia except for some fatigue on exertion. Rest of the pertinent 10 point ROS reviewed and negative  MEDICAL HISTORY:  Past Medical History:  Diagnosis Date   Anemia 2022   chronic anemia, 10/28/21 Hgb 7.9 in Epic, 12/29/21 Hgb 8.9 / Patient has been receiving weekly iron infusions, 12/15/21 - 12/29/21   Cancer ([place]) 11/06/2021   right breast cancer- lobular carcinoma & ductal carcinoma / see 11/06/21  pathology report from right breast lumpectomy in Epic   COVID-19    late 2021 or early 2022 per pt, only mild  symptoms   Fibroid    GERD (gastroesophageal reflux disease)    Hx of radiation therapy    12/04/21 - 01/01/22 as of 12/31/21, radiation to right breast   Menorrhagia    Wears contact lenses    Wears glasses     SURGICAL HISTORY: Past Surgical History:  Procedure Laterality Date   BREAST LUMPECTOMY WITH RADIOACTIVE SEED LOCALIZATION Right 11/06/2021   Procedure: RIGHT BREAST LUMPECTOMY WITH RADIOACTIVE SEED LOCALIZATION;  Surgeon: Coralie Keens, MD;  Location: [facility];  Service: General;  Laterality: Right;  LMA   TUBAL LIGATION  2008    SOCIAL HISTORY: Social History   Socioeconomic History   Marital status: Married    Spouse name: Not on file   Number of children: 2   Years of education: Not on file   Highest education level: High school graduate  Occupational History   Not on file  Tobacco Use   Smoking status: Never   Smokeless tobacco: Never  Vaping Use   Vaping Use: Never used  Substance and Sexual Activity   Alcohol use: Never   Drug use: Never   Sexual activity: Yes    Birth control/protection: Pill, Surgical    Comment: tubal ligation  Other Topics Concern   Not on file  Social History Narrative   Not on file   Social Determinants of Health   Financial Resource Strain: Not on file  Food Insecurity: No Food Insecurity   Worried About Estate manager/land agent of Food in the Last Year: Never true   [place] in the Last Year: Never true  Transportation Needs: No Transportation Needs   Lack of Transportation (Medical): No   Lack of Transportation (Non-Medical): No  Physical Activity: Not on file  Stress: Not on file  Social Connections: Not on file  Intimate Partner Violence: Not At Risk   Fear of Current or Ex-Partner: No   Emotionally Abused: No   Physically Abused: No   Sexually Abused: No    FAMILY HISTORY: Family History  Problem Relation Age of Onset   Heart attack Mother    Skin cancer Father        BCC   Fibroids Sister    Heart attack  Maternal Uncle        <50   Congestive Heart Failure Maternal Grandmother    Heart attack Maternal Grandfather    Heart attack Paternal Grandfather     ALLERGIES:  has No Known Allergies.  MEDICATIONS:  Current Outpatient Medications  Medication Sig Dispense Refill   docusate sodium (COLACE) 100 MG capsule Take 1 capsule (100 mg total) by mouth 2 (two) times daily as needed. (Patient not taking: Reported on 10/23/2021) 40 capsule 1   ferrous gluconate (FERGON) 324 MG tablet Take 1 tablet (324 mg total) by mouth every other day. 30 tablet 2   fluticasone (FLONASE) 50 MCG/ACT nasal spray Place into both nostrils daily.     magnesium hydroxide (MILK OF MAGNESIA) 400 MG/5ML suspension Take by mouth daily as needed for mild constipation.     norgestimate-ethinyl estradiol (ORTHO-CYCLEN) 0.25-35 MG-MCG tablet Take 1 tablet by mouth daily. 84 tablet 0   traMADol (ULTRAM) 50 MG tablet Take 1 tablet (50 mg total) by mouth every 6 (six) hours as needed. (Patient taking differently: Take 50 mg by mouth every 6 (six) hours as needed.  Never picked up from pharmacy.) 25 tablet 0   No current facility-administered medications for this visit.    Rest of the pertinent 10 point ROS reviewed and negative  PHYSICAL EXAMINATION: ECOG PERFORMANCE STATUS: 0 - Asymptomatic  Vitals:   01/08/22 1542  BP: 125/74  Pulse: 80  Resp: 18  Temp: (!) 97.5 F (36.4 C)  SpO2: 100%   Filed Weights   01/08/22 1542  Weight: 122 lb 6.4 oz (55.5 kg)  Physical exam deferred today in lieu of counseling  LABORATORY DATA:  I have reviewed the data as listed Lab Results  Component Value Date   WBC 5.4 01/08/2022   HGB 8.7 (L) 01/08/2022   HCT 30.1 (L) 01/08/2022   MCV 77.2 (L) 01/08/2022   PLT 401 (H) 01/08/2022   Lab Results  Component Value Date   NA 138 01/08/2022   K 3.7 01/08/2022   CL 108 01/08/2022   CO2 25 01/08/2022   Labs from today reviewed CBC showed hemoglobin of 8.7, microcytic, hypochromic,  mild thrombocytosis Iron panel showed a TIBC of 535, iron saturation of 5, ferritin pending from today  RADIOGRAPHIC STUDIES: I have personally reviewed the radiological reports and agreed with the findings in the report.  ASSESSMENT AND PLAN:  Iron deficiency anemia due to chronic blood loss Patient has iron deficiency from menorrhagia.  She was scheduled for hysterectomy however they could not proceed with this given hemoglobin of 8.9.  We have been giving her Venofer, she has received about 3 doses prior to surgery.  We will reschedule her for Venofer, 200 mg each infusion for 5 cycles. We will repeat labs at the end of June in anticipation of surgery in early July.  If she does not have a hemoglobin close to 10 at the time of labs, we can also consider packed red blood cell transfusion to facilitate the surgery.  Her iron deficiency anemia secondary to menorrhagia so hopefully she will improve after hysterectomy.  Ductal carcinoma in situ (DCIS) of right breast She is now status post lumpectomy and adjuvant radiation therapy.  She tolerated radiation very well.  She will start tamoxifen after completion of hysterectomy. She will return to clinic in July to initiate tamoxifen hoping she will have hysterectomy scheduled  Total time spent: 30 minutes  All questions were answered. The patient knows to call the clinic with any problems, questions or concerns.    Benay Pike, MD 01/08/22

## 2022-01-08 NOTE — Assessment & Plan Note (Signed)
Patient has iron deficiency from menorrhagia.  She was scheduled for hysterectomy however they could not proceed with this given hemoglobin of 8.9.  We have been giving her Venofer, she has received about 3 doses prior to surgery.  We will reschedule her for Venofer, 200 mg each infusion for 5 cycles. We will repeat labs at the end of June in anticipation of surgery in early July.  If she does not have a hemoglobin close to 10 at the time of labs, we can also consider packed red blood cell transfusion to facilitate the surgery.  Her iron deficiency anemia secondary to menorrhagia so hopefully she will improve after hysterectomy.

## 2022-01-08 NOTE — Assessment & Plan Note (Signed)
She is now status post lumpectomy and adjuvant radiation therapy.  She tolerated radiation very well.  She will start tamoxifen after completion of hysterectomy. She will return to clinic in July to initiate tamoxifen hoping she will have hysterectomy scheduled

## 2022-01-09 ENCOUNTER — Encounter: Payer: Self-pay | Admitting: Hematology and Oncology

## 2022-01-09 LAB — FERRITIN: Ferritin: 21 ng/mL (ref 11–307)

## 2022-01-17 ENCOUNTER — Inpatient Hospital Stay: Payer: 59 | Attending: Genetic Counselor

## 2022-01-17 VITALS — BP 114/82 | HR 88 | Temp 98.6°F | Resp 18

## 2022-01-17 DIAGNOSIS — D0511 Intraductal carcinoma in situ of right breast: Secondary | ICD-10-CM | POA: Diagnosis not present

## 2022-01-17 DIAGNOSIS — D500 Iron deficiency anemia secondary to blood loss (chronic): Secondary | ICD-10-CM | POA: Diagnosis present

## 2022-01-17 DIAGNOSIS — N920 Excessive and frequent menstruation with regular cycle: Secondary | ICD-10-CM | POA: Diagnosis present

## 2022-01-17 DIAGNOSIS — Z170 Estrogen receptor positive status [ER+]: Secondary | ICD-10-CM | POA: Diagnosis not present

## 2022-01-17 MED ORDER — SODIUM CHLORIDE 0.9 % IV SOLN: Freq: Once | INTRAVENOUS | Status: AC

## 2022-01-17 MED ORDER — SODIUM CHLORIDE 0.9 % IV SOLN
200.0000 mg | Freq: Once | INTRAVENOUS | Status: AC
  Administered 2022-01-17: 200 mg via INTRAVENOUS
  Filled 2022-01-17: qty 200

## 2022-01-19 ENCOUNTER — Inpatient Hospital Stay: Payer: 59

## 2022-01-19 ENCOUNTER — Other Ambulatory Visit: Payer: Self-pay

## 2022-01-19 VITALS — BP 111/70 | HR 99 | Temp 98.5°F | Resp 18

## 2022-01-19 DIAGNOSIS — D0511 Intraductal carcinoma in situ of right breast: Secondary | ICD-10-CM

## 2022-01-19 DIAGNOSIS — N920 Excessive and frequent menstruation with regular cycle: Secondary | ICD-10-CM | POA: Diagnosis not present

## 2022-01-19 MED ORDER — SODIUM CHLORIDE 0.9 % IV SOLN
200.0000 mg | Freq: Once | INTRAVENOUS | Status: AC
  Administered 2022-01-19: 200 mg via INTRAVENOUS
  Filled 2022-01-19: qty 200

## 2022-01-19 MED ORDER — SODIUM CHLORIDE 0.9 % IV SOLN: Freq: Once | INTRAVENOUS | Status: AC

## 2022-01-19 NOTE — Progress Notes (Signed)
Pt declined to stay for 30 minute observation period post Venofer infusion. VSS. No complaints at time of discharge.  

## 2022-01-19 NOTE — Patient Instructions (Signed)

## 2022-01-21 ENCOUNTER — Inpatient Hospital Stay: Payer: 59

## 2022-01-21 ENCOUNTER — Other Ambulatory Visit: Payer: Self-pay

## 2022-01-21 VITALS — BP 110/65 | HR 88 | Temp 98.1°F | Resp 17

## 2022-01-21 DIAGNOSIS — N920 Excessive and frequent menstruation with regular cycle: Secondary | ICD-10-CM | POA: Diagnosis not present

## 2022-01-21 DIAGNOSIS — D0511 Intraductal carcinoma in situ of right breast: Secondary | ICD-10-CM

## 2022-01-21 MED ORDER — SODIUM CHLORIDE 0.9 % IV SOLN
200.0000 mg | Freq: Once | INTRAVENOUS | Status: AC
  Administered 2022-01-21: 200 mg via INTRAVENOUS
  Filled 2022-01-21: qty 200

## 2022-01-21 MED ORDER — SODIUM CHLORIDE 0.9 % IV SOLN: Freq: Once | INTRAVENOUS | Status: AC

## 2022-01-21 NOTE — Patient Instructions (Signed)

## 2022-01-23 ENCOUNTER — Other Ambulatory Visit: Payer: Self-pay

## 2022-01-23 ENCOUNTER — Inpatient Hospital Stay: Payer: 59

## 2022-01-23 VITALS — BP 112/60 | HR 89 | Temp 98.8°F | Resp 16

## 2022-01-23 DIAGNOSIS — D0511 Intraductal carcinoma in situ of right breast: Secondary | ICD-10-CM

## 2022-01-23 DIAGNOSIS — N920 Excessive and frequent menstruation with regular cycle: Secondary | ICD-10-CM | POA: Diagnosis not present

## 2022-01-23 MED ORDER — SODIUM CHLORIDE 0.9 % IV SOLN
200.0000 mg | Freq: Once | INTRAVENOUS | Status: AC
  Administered 2022-01-23: 200 mg via INTRAVENOUS
  Filled 2022-01-23: qty 200

## 2022-01-23 MED ORDER — SODIUM CHLORIDE 0.9 % IV SOLN: Freq: Once | INTRAVENOUS | Status: AC

## 2022-01-23 NOTE — Progress Notes (Signed)
Pt declined to stay for 30 min post obs. Ambulatory to lobby, vss.

## 2022-01-26 ENCOUNTER — Inpatient Hospital Stay: Payer: 59

## 2022-01-26 ENCOUNTER — Other Ambulatory Visit: Payer: Self-pay

## 2022-01-26 ENCOUNTER — Encounter: Payer: Self-pay | Admitting: Hematology and Oncology

## 2022-01-26 VITALS — BP 112/72 | HR 83 | Temp 98.6°F | Resp 17 | Wt 123.2 lb

## 2022-01-26 DIAGNOSIS — N920 Excessive and frequent menstruation with regular cycle: Secondary | ICD-10-CM | POA: Diagnosis not present

## 2022-01-26 DIAGNOSIS — D0511 Intraductal carcinoma in situ of right breast: Secondary | ICD-10-CM

## 2022-01-26 MED ORDER — SODIUM CHLORIDE 0.9 % IV SOLN: Freq: Once | INTRAVENOUS | Status: AC

## 2022-01-26 MED ORDER — SODIUM CHLORIDE 0.9 % IV SOLN
200.0000 mg | Freq: Once | INTRAVENOUS | Status: AC
  Administered 2022-01-26: 200 mg via INTRAVENOUS
  Filled 2022-01-26: qty 200

## 2022-01-26 NOTE — Progress Notes (Signed)
Patient declined to stay for post-iron observation. VSS, ambulatory to lobby.

## 2022-01-29 ENCOUNTER — Telehealth: Payer: Self-pay

## 2022-01-29 NOTE — Telephone Encounter (Signed)
Spoke with pt about survivorship program with Mendel Ryder, NP. Pt is interested and scheduling will call with time and date.

## 2022-02-03 ENCOUNTER — Ambulatory Visit: Payer: No Typology Code available for payment source | Admitting: Hematology and Oncology

## 2022-02-09 ENCOUNTER — Ambulatory Visit
Admission: RE | Admit: 2022-02-09 | Discharge: 2022-02-09 | Disposition: A | Discharge status: Home / Self Care | Payer: 59 | Source: Ambulatory Visit | Attending: Radiation Oncology | Admitting: Radiation Oncology

## 2022-02-09 VITALS — BP 115/76 | HR 84 | Resp 18 | Ht 61.0 in | Wt 121.9 lb

## 2022-02-09 DIAGNOSIS — D0511 Intraductal carcinoma in situ of right breast: Secondary | ICD-10-CM | POA: Diagnosis present

## 2022-02-09 DIAGNOSIS — Z170 Estrogen receptor positive status [ER+]: Secondary | ICD-10-CM | POA: Insufficient documentation

## 2022-02-09 DIAGNOSIS — Z923 Personal history of irradiation: Secondary | ICD-10-CM | POA: Insufficient documentation

## 2022-02-09 NOTE — Progress Notes (Signed)
Radiation Oncology Follow up Note  Name: Frances Craig   Date:   02/09/2022 MRN:  [id number] DOB: 12-Sep-1975    This 46 y.o. female presents to the clinic today for 1 month follow-up status post whole breast radiation to her right breast for stage 0 ductal carcinoma in situ ER positive.  REFERRING PROVIDER: [place] Bing, MD  HPI: Patient is a 46 year old female now at 1 month having completed whole breast radiation to her right breast for a stage 0 ductal carcinoma in situ ER positive seen today in routine follow-up she is doing well overall her skin reaction has cleared she specifically denies breast tenderness cough or bone pain..  She has not yet started endocrine therapy.  And she is awaiting a TAH in the near future prior to starting that therapy.  COMPLICATIONS OF TREATMENT: none  FOLLOW UP COMPLIANCE: keeps appointments   PHYSICAL EXAM:  BP 115/76   Pulse 84   Resp 18   Ht 5\' 1"  (1.549 m)   Wt 121 lb 14.4 oz (55.3 kg)   BMI 23.03 kg/m  Lungs are clear to A&P cardiac examination essentially unremarkable with regular rate and rhythm. No dominant mass or nodularity is noted in either breast in 2 positions examined. Incision is well-healed. No axillary or supraclavicular adenopathy is appreciated. Cosmetic result is excellent.  Well-developed well-nourished patient in NAD. HEENT reveals PERLA, EOMI, discs not visualized.  Oral cavity is clear. No oral mucosal lesions are identified. Neck is clear without evidence of cervical or supraclavicular adenopathy. Lungs are clear to A&P. Cardiac examination is essentially unremarkable with regular rate and rhythm without murmur rub or thrill. Abdomen is benign with no organomegaly or masses noted. Motor sensory and DTR levels are equal and symmetric in the upper and lower extremities. Cranial nerves II through XII are grossly intact. Proprioception is intact. No peripheral adenopathy or edema is identified. No motor or sensory levels are  noted. Crude visual fields are within normal range.  RADIOLOGY RESULTS: No current films for review  PLAN: At the present time patient is doing well 1 month out from whole breast radiation with extremely low side effect profile.  And pleased with her overall progress.  She is scheduled in near future for TAH is been slightly delayed based on her blood counts.  I will see her back in 5 months for follow-up.  Patient knows to call with any concerns.  I would like to take this opportunity to thank you for allowing me to participate in the care of your patient.Carmina Miller, MD

## 2022-02-10 ENCOUNTER — Encounter: Payer: Self-pay | Admitting: Hematology and Oncology

## 2022-02-10 ENCOUNTER — Inpatient Hospital Stay: Payer: 59

## 2022-02-10 ENCOUNTER — Inpatient Hospital Stay (HOSPITAL_BASED_OUTPATIENT_CLINIC_OR_DEPARTMENT_OTHER): Payer: 59 | Admitting: Hematology and Oncology

## 2022-02-10 ENCOUNTER — Other Ambulatory Visit: Payer: Self-pay | Admitting: *Deleted

## 2022-02-10 ENCOUNTER — Other Ambulatory Visit: Payer: Self-pay

## 2022-02-10 DIAGNOSIS — D500 Iron deficiency anemia secondary to blood loss (chronic): Secondary | ICD-10-CM

## 2022-02-10 DIAGNOSIS — D0511 Intraductal carcinoma in situ of right breast: Secondary | ICD-10-CM

## 2022-02-10 DIAGNOSIS — N920 Excessive and frequent menstruation with regular cycle: Secondary | ICD-10-CM | POA: Diagnosis not present

## 2022-02-10 LAB — CMP (CANCER CENTER ONLY)
ALT: 7 U/L (ref 0–44)
AST: 10 U/L — ABNORMAL LOW (ref 15–41)
Albumin: 4 g/dL (ref 3.5–5.0)
Alkaline Phosphatase: 48 U/L (ref 38–126)
Anion gap: 6 (ref 5–15)
BUN: 11 mg/dL (ref 6–20)
CO2: 23 mmol/L (ref 22–32)
Calcium: 8.8 mg/dL — ABNORMAL LOW (ref 8.9–10.3)
Chloride: 107 mmol/L (ref 98–111)
Creatinine: 0.85 mg/dL (ref 0.44–1.00)
GFR, Estimated: >60 mL/min (ref >60)
Glucose, Bld: 107 mg/dL — ABNORMAL HIGH (ref 70–99)
Potassium: 4 mmol/L (ref 3.5–5.1)
Sodium: 136 mmol/L (ref 135–145)
Total Bilirubin: 0.3 mg/dL (ref 0.3–1.2)
Total Protein: 6.1 g/dL — ABNORMAL LOW (ref 6.5–8.1)

## 2022-02-10 LAB — CBC WITH DIFFERENTIAL (CANCER CENTER ONLY)
Abs Immature Granulocytes: 0.01 10*3/uL (ref 0.00–0.07)
Basophils Absolute: 0.1 10*3/uL (ref 0.0–0.1)
Basophils Relative: 2 %
Eosinophils Absolute: 0.2 10*3/uL (ref 0.0–0.5)
Eosinophils Relative: 4 %
HCT: 36.6 % (ref 36.0–46.0)
Hemoglobin: 11.4 g/dL — ABNORMAL LOW (ref 12.0–15.0)
Immature Granulocytes: 0 %
Lymphocytes Relative: 13 %
Lymphs Abs: 0.5 10*3/uL — ABNORMAL LOW (ref 0.7–4.0)
MCH: 26.8 pg (ref 26.0–34.0)
MCHC: 31.1 g/dL (ref 30.0–36.0)
MCV: 85.9 fL (ref 80.0–100.0)
Monocytes Absolute: 0.2 10*3/uL (ref 0.1–1.0)
Monocytes Relative: 6 %
Neutro Abs: 3 10*3/uL (ref 1.7–7.7)
Neutrophils Relative %: 75 %
Platelet Count: 354 10*3/uL (ref 150–400)
RBC: 4.26 MIL/uL (ref 3.87–5.11)
RDW: 25.3 % — ABNORMAL HIGH (ref 11.5–15.5)
WBC Count: 3.9 10*3/uL — ABNORMAL LOW (ref 4.0–10.5)
nRBC: 0 % (ref 0.0–0.2)

## 2022-02-10 LAB — IRON AND IRON BINDING CAPACITY (CC-WL,HP ONLY)
Iron: 73 ug/dL (ref 28–170)
Saturation Ratios: 15 % (ref 10.4–31.8)
TIBC: 489 ug/dL — ABNORMAL HIGH (ref 250–450)
UIBC: 416 ug/dL (ref 148–442)

## 2022-02-10 LAB — FERRITIN: Ferritin: 36 ng/mL (ref 11–307)

## 2022-02-10 MED ORDER — TAMOXIFEN CITRATE 20 MG PO TABS: 20.0000 mg | ORAL_TABLET | Freq: Every day | ORAL | 5 refills | Status: DC

## 2022-02-12 ENCOUNTER — Other Ambulatory Visit: Payer: Self-pay

## 2022-02-12 ENCOUNTER — Encounter (HOSPITAL_BASED_OUTPATIENT_CLINIC_OR_DEPARTMENT_OTHER): Payer: Self-pay | Admitting: Obstetrics & Gynecology

## 2022-02-12 ENCOUNTER — Encounter (HOSPITAL_BASED_OUTPATIENT_CLINIC_OR_DEPARTMENT_OTHER): Payer: Self-pay

## 2022-02-12 NOTE — Progress Notes (Signed)
Spoke w/ via phone for pre-op interview---[facility] Lab needs dos----urine pregnancy per anesthesia, surgeon orders pending as of 02/12/22               Lab results------02/23/22 lab appt for CBC, type & screen COVID test -----patient states asymptomatic no test needed Arrive at -------0630 on Wednesday, 01/26/22 NPO after MN NO Solid Food.  Clear liquids from MN until---0530 Med rec completed Medications to take morning of surgery -----Flonase prn, Ortho-cyclen Diabetic medication -----n/a Patient instructed no nail polish to be worn day of surgery Patient instructed to bring photo id and insurance card day of surgery Patient aware to have Driver (ride ) / caregiver    for 24 hours after surgery - husband, Coralyn Mark Patient Special Instructions -----Extended stay instructions given. Pre-Op special Istructions -----Requested orders from Dr. Sabra Heck via Epic IB on 02/12/22. Patient verbalized understanding of instructions that were given at this phone interview. Patient denies shortness of breath, chest pain, fever, cough at this phone interview.

## 2022-02-12 NOTE — Progress Notes (Signed)
Your procedure is scheduled on Wednesday, 02/25/22.  Report to [facility].   Call this number if you have problems the morning of surgery  :[phone].   OUR ADDRESS IS [address].  WE ARE LOCATED IN [facility].  PLEASE BRING YOUR INSURANCE CARD AND PHOTO ID DAY OF SURGERY.  ONLY 2 PEOPLE ARE ALLOWED IN  WAITING  ROOM.                                      REMEMBER:  DO NOT EAT FOOD, CANDY GUM OR MINTS  AFTER MIDNIGHT THE NIGHT BEFORE YOUR SURGERY . YOU MAY HAVE CLEAR LIQUIDS FROM MIDNIGHT THE NIGHT BEFORE YOUR SURGERY UNTIL  5:30 AM. NO CLEAR LIQUIDS AFTER  5:30 AM DAY OF SURGERY.  YOU MAY  BRUSH YOUR TEETH MORNING OF SURGERY AND RINSE YOUR MOUTH OUT, NO CHEWING GUM CANDY OR MINTS.     CLEAR LIQUID DIET   Foods Allowed                                                                     Foods Excluded  Coffee and tea, regular and decaf                             liquids that you cannot  Plain Jell-O                                                                   see through such as: Fruit ices (not with fruit pulp)                                     milk, soups, orange juice  Plain  Popsicles                                    All solid food Carbonated beverages, regular and diet                                    Cranberry, grape and apple juices Sports drinks like Gatorade _____________________________________________________________________     TAKE THESE MEDICATIONS MORNING OF SURGERY: Flonase if needed, Ortho-cyclen    UP TO 4 VISITORS  MAY VISIT IN THE EXTENDED RECOVERY ROOM UNTIL 800 PM ONLY.  ONE  VISITOR AGE 11 AND OVER MAY SPEND THE NIGHT AND MUST BE IN EXTENDED RECOVERY ROOM NO LATER THAN 800 PM . YOUR DISCHARGE TIME AFTER YOU SPEND THE NIGHT IS 900 AM THE MORNING AFTER YOUR SURGERY.  YOU MAY PACK A SMALL OVERNIGHT BAG WITH TOILETRIES FOR YOUR OVERNIGHT STAY  IF YOU WISH.  YOUR PRESCRIPTION  MEDICATIONS WILL BE PROVIDED DURING [facility].                                      DO NOT WEAR JEWERLY, MAKE UP. DO NOT WEAR LOTIONS, POWDERS, PERFUMES OR NAIL POLISH ON YOUR FINGERNAILS. TOENAIL POLISH IS OK TO WEAR. DO NOT SHAVE FOR 48 HOURS PRIOR TO DAY OF SURGERY. MEN MAY SHAVE FACE AND NECK. CONTACTS, GLASSES, OR DENTURES MAY NOT BE WORN TO SURGERY.  REMEMBER: NO SMOKING, DRUGS OR ALCOHOL FOR 24 HOURS BEFORE YOUR SURGERY.                                    [facility] IS NOT RESPONSIBLE  FOR ANY BELONGINGS.                                                                    Marland Kitchen           [facility] - Preparing for Surgery Before surgery, you can play an important role.  Because skin is not sterile, your skin needs to be as free of germs as possible.  You can reduce the number of germs on your skin by washing with CHG (chlorahexidine gluconate) soap before surgery.  CHG is an antiseptic cleaner which kills germs and bonds with the skin to continue killing germs even after washing. Please DO NOT use if you have an allergy to CHG or antibacterial soaps.  If your skin becomes reddened/irritated stop using the CHG and inform your nurse when you arrive at Short Stay. Do not shave (including legs and underarms) for at least 48 hours prior to the first CHG shower.  You may shave your face/neck. Please follow these instructions carefully:  1.  Shower with CHG Soap the night before surgery and the  morning of Surgery.  2.  If you choose to wash your hair, wash your hair first as usual with your  normal  shampoo.  3.  After you shampoo, rinse your hair and body thoroughly to remove the  shampoo.                            4.  Use CHG as you would any other liquid soap.  You can apply chg directly  to the skin and wash , please wash your belly button thoroughly with chg soap provided night before and morning of your surgery.                     Gently with a scrungie or clean washcloth.  5.   Apply the CHG Soap to your body ONLY FROM THE NECK DOWN.   Do not use on face/ open                           Wound or open sores. Avoid contact with eyes, ears mouth and genitals (private parts).  Wash face,  Genitals (private parts) with your normal soap.             6.  Wash thoroughly, paying special attention to the area where your surgery  will be performed.  7.  Thoroughly rinse your body with warm water from the neck down.  8.  DO NOT shower/wash with your normal soap after using and rinsing off  the CHG Soap.                9.  Pat yourself dry with a clean towel.            10.  Wear clean pajamas.            11.  Place clean sheets on your bed the night of your first shower and do not  sleep with pets. Day of Surgery : Do not apply any lotions/deodorants the morning of surgery.  Please wear clean clothes to the hospital/surgery center.  IF YOU HAVE ANY SKIN IRRITATION OR PROBLEMS WITH THE SURGICAL SOAP, PLEASE GET A BAR OF GOLD DIAL SOAP AND SHOWER THE NIGHT BEFORE YOUR SURGERY AND THE MORNING OF YOUR SURGERY. PLEASE LET THE NURSE KNOW MORNING OF YOUR SURGERY IF YOU HAD ANY PROBLEMS WITH THE SURGICAL SOAP.   ________________________________________________________________________                                                        QUESTIONS [facility] PRE OP NURSE PHONE [phone].

## 2022-02-18 ENCOUNTER — Other Ambulatory Visit (HOSPITAL_BASED_OUTPATIENT_CLINIC_OR_DEPARTMENT_OTHER): Payer: Self-pay | Admitting: Obstetrics & Gynecology

## 2022-02-18 DIAGNOSIS — D500 Iron deficiency anemia secondary to blood loss (chronic): Secondary | ICD-10-CM

## 2022-02-18 DIAGNOSIS — N920 Excessive and frequent menstruation with regular cycle: Secondary | ICD-10-CM

## 2022-02-18 DIAGNOSIS — Z01818 Encounter for other preprocedural examination: Secondary | ICD-10-CM

## 2022-02-23 ENCOUNTER — Other Ambulatory Visit (HOSPITAL_BASED_OUTPATIENT_CLINIC_OR_DEPARTMENT_OTHER): Payer: Self-pay | Admitting: Obstetrics & Gynecology

## 2022-02-23 ENCOUNTER — Encounter (HOSPITAL_COMMUNITY)
Admission: RE | Admit: 2022-02-23 | Discharge: 2022-02-23 | Disposition: A | Discharge status: Home / Self Care | Payer: 59 | Source: Ambulatory Visit | Attending: Obstetrics & Gynecology | Admitting: Obstetrics & Gynecology

## 2022-02-23 DIAGNOSIS — N920 Excessive and frequent menstruation with regular cycle: Secondary | ICD-10-CM

## 2022-02-23 DIAGNOSIS — D500 Iron deficiency anemia secondary to blood loss (chronic): Secondary | ICD-10-CM

## 2022-02-23 DIAGNOSIS — Z01812 Encounter for preprocedural laboratory examination: Secondary | ICD-10-CM | POA: Insufficient documentation

## 2022-02-23 DIAGNOSIS — Z01818 Encounter for other preprocedural examination: Secondary | ICD-10-CM

## 2022-02-23 LAB — CBC
HCT: 37.6 % (ref 36.0–46.0)
Hemoglobin: 11.5 g/dL — ABNORMAL LOW (ref 12.0–15.0)
MCH: 26.8 pg (ref 26.0–34.0)
MCHC: 30.6 g/dL (ref 30.0–36.0)
MCV: 87.6 fL (ref 80.0–100.0)
Platelets: 457 10*3/uL — ABNORMAL HIGH (ref 150–400)
RBC: 4.29 MIL/uL (ref 3.87–5.11)
RDW: 22.1 % — ABNORMAL HIGH (ref 11.5–15.5)
WBC: 5.7 10*3/uL (ref 4.0–10.5)
nRBC: 0 % (ref 0.0–0.2)

## 2022-02-23 LAB — APTT: aPTT: 27 seconds (ref 24–36)

## 2022-02-23 LAB — PROTIME-INR
INR: 1 (ref 0.8–1.2)
Prothrombin Time: 13.4 seconds (ref 11.4–15.2)

## 2022-02-24 NOTE — Anesthesia Preprocedure Evaluation (Signed)
Anesthesia Evaluation  Patient identified by MRN, date of birth, ID band Patient awake    Reviewed: Allergy & Precautions, NPO status , Patient's Chart, lab work & pertinent test results  History of Anesthesia Complications Negative for: history of anesthetic complications  Airway Mallampati: II  TM Distance: >3 FB Neck ROM: Full    Dental no notable dental hx. (+) Dental Advisory Given   Pulmonary neg pulmonary ROS,    Pulmonary exam normal        Cardiovascular negative cardio ROS Normal cardiovascular exam     Neuro/Psych negative neurological ROS     GI/Hepatic Neg liver ROS, GERD  ,  Endo/Other  negative endocrine ROS  Renal/GU negative Renal ROS     Musculoskeletal negative musculoskeletal ROS (+)   Abdominal   Peds  Hematology  (+) Blood dyscrasia, anemia ,   Anesthesia Other Findings   Reproductive/Obstetrics                            Anesthesia Physical  Anesthesia Plan  ASA: 2  Anesthesia Plan: General   Post-op Pain Management: Tylenol PO (pre-op)* and Toradol IV (intra-op)*   Induction: Intravenous, Rapid sequence and Cricoid pressure planned  PONV Risk Score and Plan: 4 or greater and Ondansetron, Dexamethasone, Treatment may vary due to age or medical condition, Midazolam and Scopolamine patch - Pre-op  Airway Management Planned: Oral ETT  Additional Equipment:   Intra-op Plan:   Post-operative Plan: Extubation in OR  Informed Consent: I have reviewed the patients History and Physical, chart, labs and discussed the procedure including the risks, benefits and alternatives for the proposed anesthesia with the patient or authorized representative who has indicated his/her understanding and acceptance.     Dental advisory given  Plan Discussed with: Anesthesiologist and CRNA  Anesthesia Plan Comments: (Pt reports consuming a teaspoon of nondairy creamer in  her coffee 3 hours prior to surgery.)       Anesthesia Quick Evaluation

## 2022-02-25 ENCOUNTER — Encounter (HOSPITAL_BASED_OUTPATIENT_CLINIC_OR_DEPARTMENT_OTHER): Payer: Self-pay | Admitting: Obstetrics & Gynecology

## 2022-02-25 ENCOUNTER — Other Ambulatory Visit (HOSPITAL_BASED_OUTPATIENT_CLINIC_OR_DEPARTMENT_OTHER): Payer: Self-pay | Admitting: Obstetrics & Gynecology

## 2022-02-25 ENCOUNTER — Other Ambulatory Visit: Payer: Self-pay

## 2022-02-25 ENCOUNTER — Encounter (HOSPITAL_BASED_OUTPATIENT_CLINIC_OR_DEPARTMENT_OTHER)
Admission: RE | Disposition: A | Discharge status: Home / Self Care | Payer: Self-pay | Source: Home / Self Care | Attending: Obstetrics & Gynecology

## 2022-02-25 ENCOUNTER — Encounter: Payer: Self-pay | Admitting: Hematology and Oncology

## 2022-02-25 ENCOUNTER — Other Ambulatory Visit (HOSPITAL_COMMUNITY): Payer: Self-pay

## 2022-02-25 ENCOUNTER — Ambulatory Visit (HOSPITAL_BASED_OUTPATIENT_CLINIC_OR_DEPARTMENT_OTHER): Payer: 59 | Admitting: Anesthesiology

## 2022-02-25 ENCOUNTER — Ambulatory Visit (HOSPITAL_BASED_OUTPATIENT_CLINIC_OR_DEPARTMENT_OTHER)
Admission: RE | Admit: 2022-02-25 | Discharge: 2022-02-25 | Disposition: A | Discharge status: Home / Self Care | Payer: 59 | Attending: Obstetrics & Gynecology | Admitting: Obstetrics & Gynecology

## 2022-02-25 DIAGNOSIS — N920 Excessive and frequent menstruation with regular cycle: Secondary | ICD-10-CM | POA: Insufficient documentation

## 2022-02-25 DIAGNOSIS — N8003 Adenomyosis of the uterus: Secondary | ICD-10-CM | POA: Insufficient documentation

## 2022-02-25 DIAGNOSIS — N852 Hypertrophy of uterus: Secondary | ICD-10-CM

## 2022-02-25 DIAGNOSIS — N841 Polyp of cervix uteri: Secondary | ICD-10-CM | POA: Diagnosis not present

## 2022-02-25 DIAGNOSIS — D509 Iron deficiency anemia, unspecified: Secondary | ICD-10-CM | POA: Insufficient documentation

## 2022-02-25 DIAGNOSIS — D259 Leiomyoma of uterus, unspecified: Secondary | ICD-10-CM | POA: Diagnosis present

## 2022-02-25 DIAGNOSIS — N888 Other specified noninflammatory disorders of cervix uteri: Secondary | ICD-10-CM | POA: Insufficient documentation

## 2022-02-25 DIAGNOSIS — D500 Iron deficiency anemia secondary to blood loss (chronic): Secondary | ICD-10-CM | POA: Diagnosis not present

## 2022-02-25 DIAGNOSIS — D251 Intramural leiomyoma of uterus: Secondary | ICD-10-CM | POA: Insufficient documentation

## 2022-02-25 DIAGNOSIS — D250 Submucous leiomyoma of uterus: Secondary | ICD-10-CM

## 2022-02-25 DIAGNOSIS — D252 Subserosal leiomyoma of uterus: Secondary | ICD-10-CM

## 2022-02-25 DIAGNOSIS — Z01818 Encounter for other preprocedural examination: Secondary | ICD-10-CM

## 2022-02-25 HISTORY — PX: TOTAL LAPAROSCOPIC HYSTERECTOMY WITH SALPINGECTOMY: SHX6742

## 2022-02-25 HISTORY — PX: CYSTOSCOPY: SHX5120

## 2022-02-25 LAB — TYPE AND SCREEN
ABO/RH(D): O POS
Antibody Screen: NEGATIVE

## 2022-02-25 LAB — HEMOGLOBIN: Hemoglobin: 9.3 g/dL — ABNORMAL LOW (ref 12.0–15.0)

## 2022-02-25 LAB — POCT PREGNANCY, URINE: Preg Test, Ur: NEGATIVE

## 2022-02-25 SURGERY — HYSTERECTOMY, TOTAL, LAPAROSCOPIC, WITH SALPINGECTOMY
Anesthesia: General

## 2022-02-25 MED ORDER — KETOROLAC TROMETHAMINE 30 MG/ML IJ SOLN
30.0000 mg | Freq: Four times a day (QID) | INTRAMUSCULAR | Status: DC
  Administered 2022-02-25: 30 mg via INTRAVENOUS

## 2022-02-25 MED ORDER — GABAPENTIN 100 MG PO CAPS
100.0000 mg | ORAL_CAPSULE | Freq: Three times a day (TID) | ORAL | Status: DC
Start: 2022-02-25 — End: 2022-02-25
  Administered 2022-02-25: 100 mg via ORAL

## 2022-02-25 MED ORDER — PHENYLEPHRINE HCL (PRESSORS) 10 MG/ML IV SOLN
INTRAVENOUS | Status: DC | PRN
  Administered 2022-02-25 (×7): 80 ug via INTRAVENOUS

## 2022-02-25 MED ORDER — LIDOCAINE HCL (CARDIAC) PF 100 MG/5ML IV SOSY
PREFILLED_SYRINGE | INTRAVENOUS | Status: DC | PRN
  Administered 2022-02-25: 100 mg via INTRAVENOUS

## 2022-02-25 MED ORDER — ONDANSETRON HCL 4 MG/2ML IJ SOLN
INTRAMUSCULAR | Status: AC
  Filled 2022-02-25: qty 2

## 2022-02-25 MED ORDER — FENTANYL CITRATE (PF) 250 MCG/5ML IJ SOLN
INTRAMUSCULAR | Status: AC
  Filled 2022-02-25: qty 5

## 2022-02-25 MED ORDER — SODIUM CHLORIDE 0.9 % IV SOLN
INTRAVENOUS | Status: AC | PRN
  Administered 2022-02-25: 1000 mL via INTRAMUSCULAR

## 2022-02-25 MED ORDER — AMISULPRIDE (ANTIEMETIC) 5 MG/2ML IV SOLN: 10.0000 mg | Freq: Once | INTRAVENOUS | Status: DC | PRN

## 2022-02-25 MED ORDER — SCOPOLAMINE 1 MG/3DAYS TD PT72
1.0000 | MEDICATED_PATCH | TRANSDERMAL | Status: DC
  Administered 2022-02-25: 1.5 mg via TRANSDERMAL

## 2022-02-25 MED ORDER — SUCCINYLCHOLINE CHLORIDE 200 MG/10ML IV SOSY
PREFILLED_SYRINGE | INTRAVENOUS | Status: DC | PRN
  Administered 2022-02-25: 140 mg via INTRAVENOUS

## 2022-02-25 MED ORDER — DEXAMETHASONE SODIUM PHOSPHATE 10 MG/ML IJ SOLN
INTRAMUSCULAR | Status: AC
  Filled 2022-02-25: qty 1

## 2022-02-25 MED ORDER — SODIUM CHLORIDE 0.9 % IV SOLN
INTRAVENOUS | Status: AC
  Filled 2022-02-25: qty 2

## 2022-02-25 MED ORDER — MIDAZOLAM HCL 2 MG/2ML IJ SOLN
INTRAMUSCULAR | Status: AC
  Filled 2022-02-25: qty 2

## 2022-02-25 MED ORDER — PROMETHAZINE HCL 25 MG/ML IJ SOLN: 6.2500 mg | INTRAMUSCULAR | Status: DC | PRN

## 2022-02-25 MED ORDER — ALBUMIN HUMAN 5 % IV SOLN: INTRAVENOUS | Status: DC | PRN

## 2022-02-25 MED ORDER — SODIUM CHLORIDE 0.9 % IV SOLN
2.0000 g | INTRAVENOUS | Status: AC
  Administered 2022-02-25: 2 g via INTRAVENOUS

## 2022-02-25 MED ORDER — ALUM & MAG HYDROXIDE-SIMETH 200-200-20 MG/5ML PO SUSP: 30.0000 mL | ORAL | Status: DC | PRN

## 2022-02-25 MED ORDER — ONDANSETRON HCL 4 MG/2ML IJ SOLN
INTRAMUSCULAR | Status: DC | PRN
  Administered 2022-02-25: 4 mg via INTRAVENOUS

## 2022-02-25 MED ORDER — FENTANYL CITRATE (PF) 100 MCG/2ML IJ SOLN
INTRAMUSCULAR | Status: DC | PRN
Start: 2022-02-25 — End: 2022-02-25
  Administered 2022-02-25 (×2): 50 ug via INTRAVENOUS
  Administered 2022-02-25: 100 ug via INTRAVENOUS
  Administered 2022-02-25: 50 ug via INTRAVENOUS

## 2022-02-25 MED ORDER — ACETAMINOPHEN 500 MG PO TABS
1000.0000 mg | ORAL_TABLET | ORAL | Status: AC
  Administered 2022-02-25: 1000 mg via ORAL

## 2022-02-25 MED ORDER — MENTHOL 3 MG MT LOZG
1.0000 | LOZENGE | OROMUCOSAL | Status: DC | PRN
Start: 2022-02-25 — End: 2022-02-25

## 2022-02-25 MED ORDER — IBUPROFEN 800 MG PO TABS
800.0000 mg | ORAL_TABLET | Freq: Three times a day (TID) | ORAL | 0 refills | Status: DC | PRN
  Filled 2022-02-25: qty 30, 10d supply, fill #0

## 2022-02-25 MED ORDER — SUCCINYLCHOLINE CHLORIDE 200 MG/10ML IV SOSY
PREFILLED_SYRINGE | INTRAVENOUS | Status: AC
  Filled 2022-02-25: qty 10

## 2022-02-25 MED ORDER — PROPOFOL 10 MG/ML IV BOLUS
INTRAVENOUS | Status: DC | PRN
  Administered 2022-02-25: 150 mg via INTRAVENOUS

## 2022-02-25 MED ORDER — OXYCODONE-ACETAMINOPHEN 5-325 MG PO TABS
2.0000 | ORAL_TABLET | ORAL | 0 refills | Status: AC | PRN
  Filled 2022-02-25: qty 30, 5d supply, fill #0

## 2022-02-25 MED ORDER — ONDANSETRON HCL 4 MG/2ML IJ SOLN: 4.0000 mg | Freq: Four times a day (QID) | INTRAMUSCULAR | Status: DC | PRN

## 2022-02-25 MED ORDER — CELECOXIB 200 MG PO CAPS
200.0000 mg | ORAL_CAPSULE | Freq: Once | ORAL | Status: AC
  Administered 2022-02-25: 200 mg via ORAL

## 2022-02-25 MED ORDER — PROPOFOL 10 MG/ML IV BOLUS
INTRAVENOUS | Status: AC
  Filled 2022-02-25: qty 20

## 2022-02-25 MED ORDER — LIDOCAINE HCL (PF) 2 % IJ SOLN
INTRAMUSCULAR | Status: AC
  Filled 2022-02-25: qty 5

## 2022-02-25 MED ORDER — HEMOSTATIC AGENTS (NO CHARGE) OPTIME
TOPICAL | Status: DC | PRN
  Administered 2022-02-25: 1 via TOPICAL

## 2022-02-25 MED ORDER — KETOROLAC TROMETHAMINE 30 MG/ML IJ SOLN
INTRAMUSCULAR | Status: AC
  Filled 2022-02-25: qty 1

## 2022-02-25 MED ORDER — GABAPENTIN 100 MG PO CAPS
ORAL_CAPSULE | ORAL | Status: AC
  Filled 2022-02-25: qty 1

## 2022-02-25 MED ORDER — POVIDONE-IODINE 10 % EX SWAB: 2.0000 | Freq: Once | CUTANEOUS | Status: DC

## 2022-02-25 MED ORDER — BUPIVACAINE HCL (PF) 0.25 % IJ SOLN
INTRAMUSCULAR | Status: DC | PRN
  Administered 2022-02-25: 5 mL

## 2022-02-25 MED ORDER — DEXAMETHASONE SODIUM PHOSPHATE 4 MG/ML IJ SOLN
INTRAMUSCULAR | Status: DC | PRN
  Administered 2022-02-25: 10 mg via INTRAVENOUS

## 2022-02-25 MED ORDER — ENOXAPARIN SODIUM 40 MG/0.4ML IJ SOSY
PREFILLED_SYRINGE | INTRAMUSCULAR | Status: AC
  Filled 2022-02-25: qty 0.4

## 2022-02-25 MED ORDER — ACETAMINOPHEN 500 MG PO TABS
ORAL_TABLET | ORAL | Status: AC
  Filled 2022-02-25: qty 2

## 2022-02-25 MED ORDER — SIMETHICONE 80 MG PO CHEW: 80.0000 mg | CHEWABLE_TABLET | Freq: Four times a day (QID) | ORAL | Status: DC | PRN

## 2022-02-25 MED ORDER — SODIUM CHLORIDE 0.9 % IR SOLN
Status: DC | PRN
  Administered 2022-02-25: 1000 mL via INTRAVESICAL

## 2022-02-25 MED ORDER — ROCURONIUM BROMIDE 100 MG/10ML IV SOLN
INTRAVENOUS | Status: DC | PRN
  Administered 2022-02-25: 60 mg via INTRAVENOUS
  Administered 2022-02-25 (×2): 20 mg via INTRAVENOUS

## 2022-02-25 MED ORDER — PANTOPRAZOLE SODIUM 40 MG IV SOLR
40.0000 mg | Freq: Every day | INTRAVENOUS | Status: DC
Start: 2022-02-25 — End: 2022-02-25

## 2022-02-25 MED ORDER — SCOPOLAMINE 1 MG/3DAYS TD PT72
MEDICATED_PATCH | TRANSDERMAL | Status: AC
  Filled 2022-02-25: qty 1

## 2022-02-25 MED ORDER — IBUPROFEN 200 MG PO TABS
600.0000 mg | ORAL_TABLET | Freq: Four times a day (QID) | ORAL | Status: DC
Start: 2022-02-26 — End: 2022-02-25

## 2022-02-25 MED ORDER — 0.9 % SODIUM CHLORIDE (POUR BTL) OPTIME
TOPICAL | Status: DC | PRN
  Administered 2022-02-25: 500 mL

## 2022-02-25 MED ORDER — MORPHINE SULFATE (PF) 4 MG/ML IV SOLN: 1.0000 mg | INTRAVENOUS | Status: DC | PRN

## 2022-02-25 MED ORDER — ALBUMIN HUMAN 5 % IV SOLN
INTRAVENOUS | Status: AC
  Filled 2022-02-25: qty 250

## 2022-02-25 MED ORDER — PHENYLEPHRINE 80 MCG/ML (10ML) SYRINGE FOR IV PUSH (FOR BLOOD PRESSURE SUPPORT)
PREFILLED_SYRINGE | INTRAVENOUS | Status: AC
  Filled 2022-02-25: qty 10

## 2022-02-25 MED ORDER — FENTANYL CITRATE (PF) 100 MCG/2ML IJ SOLN: 25.0000 ug | INTRAMUSCULAR | Status: DC | PRN

## 2022-02-25 MED ORDER — SODIUM CHLORIDE 0.9 % IV SOLN
INTRAVENOUS | Status: DC | PRN
  Administered 2022-02-25: 60 mL

## 2022-02-25 MED ORDER — LACTATED RINGERS IV SOLN: INTRAVENOUS | Status: DC | PRN

## 2022-02-25 MED ORDER — SUGAMMADEX SODIUM 200 MG/2ML IV SOLN
INTRAVENOUS | Status: DC | PRN
  Administered 2022-02-25: 200 mg via INTRAVENOUS

## 2022-02-25 MED ORDER — ONDANSETRON HCL 4 MG PO TABS: 4.0000 mg | ORAL_TABLET | Freq: Four times a day (QID) | ORAL | Status: DC | PRN

## 2022-02-25 MED ORDER — ENOXAPARIN SODIUM 40 MG/0.4ML IJ SOSY
40.0000 mg | PREFILLED_SYRINGE | INTRAMUSCULAR | Status: AC
  Administered 2022-02-25: 40 mg via SUBCUTANEOUS

## 2022-02-25 MED ORDER — ROCURONIUM BROMIDE 10 MG/ML (PF) SYRINGE
PREFILLED_SYRINGE | INTRAVENOUS | Status: AC
  Filled 2022-02-25: qty 10

## 2022-02-25 MED ORDER — LACTATED RINGERS IV SOLN
INTRAVENOUS | Status: DC
Start: 2022-02-25 — End: 2022-02-25

## 2022-02-25 MED ORDER — MIDAZOLAM HCL 5 MG/5ML IJ SOLN
INTRAMUSCULAR | Status: DC | PRN
  Administered 2022-02-25: 2 mg via INTRAVENOUS

## 2022-02-25 MED ORDER — CELECOXIB 200 MG PO CAPS
ORAL_CAPSULE | ORAL | Status: AC
  Filled 2022-02-25: qty 1

## 2022-02-25 MED ORDER — OXYCODONE-ACETAMINOPHEN 5-325 MG PO TABS: 1.0000 | ORAL_TABLET | ORAL | Status: DC | PRN

## 2022-02-25 MED ORDER — DEXTROSE-NACL 5-0.45 % IV SOLN
INTRAVENOUS | Status: DC
Start: 2022-02-25 — End: 2022-02-25

## 2022-02-25 SURGICAL SUPPLY — 78 items
ADH SKN CLS APL DERMABOND .7 (GAUZE/BANDAGES/DRESSINGS) ×2
APL PRP STRL LF DISP 70% ISPRP (MISCELLANEOUS) ×2
APL SRG 38 LTWT LNG FL B (MISCELLANEOUS) ×2
APL SWBSTK 6 STRL LF DISP (MISCELLANEOUS) ×4
APPLICATOR ARISTA FLEXITIP XL (MISCELLANEOUS) ×1 IMPLANT
APPLICATOR COTTON TIP 6 STRL (MISCELLANEOUS) IMPLANT
APPLICATOR COTTON TIP 6IN STRL (MISCELLANEOUS) ×6
BLADE SURG 10 STRL SS (BLADE) IMPLANT
CABLE HIGH FREQUENCY MONO STRZ (ELECTRODE) IMPLANT
CELL SAVER LIPIGURD (MISCELLANEOUS) IMPLANT
CHLORAPREP W/TINT 26 (MISCELLANEOUS) ×4 IMPLANT
COVER BACK TABLE 60X90IN (DRAPES) ×4 IMPLANT
COVER MAYO STAND STRL (DRAPES) ×4 IMPLANT
COVER SURGICAL LIGHT HANDLE (MISCELLANEOUS) ×1 IMPLANT
DERMABOND ADVANCED (GAUZE/BANDAGES/DRESSINGS) ×1
DERMABOND ADVANCED .7 DNX12 (GAUZE/BANDAGES/DRESSINGS) ×3 IMPLANT
DEVICE RETRIEVAL ALEXIS 14 (MISCELLANEOUS) IMPLANT
DILATOR CANAL MILEX (MISCELLANEOUS) IMPLANT
DRSG COVADERM PLUS 2X2 (GAUZE/BANDAGES/DRESSINGS) IMPLANT
EXTRT SYSTEM ALEXIS 14CM (MISCELLANEOUS)
EXTRT SYSTEM ALEXIS 17CM (MISCELLANEOUS)
GAUZE 4X4 16PLY ~~LOC~~+RFID DBL (SPONGE) ×7 IMPLANT
GLOVE BIO SURGEON STRL SZ 6.5 (GLOVE) ×4 IMPLANT
GLOVE BIOGEL PI IND STRL 6.5 (GLOVE) ×3 IMPLANT
GLOVE BIOGEL PI IND STRL 7.0 (GLOVE) ×6 IMPLANT
GLOVE BIOGEL PI INDICATOR 6.5 (GLOVE) ×1
GLOVE BIOGEL PI INDICATOR 7.0 (GLOVE) ×2
GLOVE ECLIPSE 6.5 STRL STRAW (GLOVE) ×8 IMPLANT
GOWN STRL REUS W/TWL XL LVL3 (GOWN DISPOSABLE) ×8 IMPLANT
HEMOSTAT ARISTA ABSORB 3G PWDR (HEMOSTASIS) ×1 IMPLANT
KIT TURNOVER CYSTO (KITS) ×4 IMPLANT
LEGGING LITHOTOMY PAIR STRL (DRAPES) ×4 IMPLANT
LIGASURE VESSEL 5MM BLUNT TIP (ELECTROSURGICAL) ×4 IMPLANT
NDL SPNL 22GX7 QUINCKE BK (NEEDLE) IMPLANT
NEEDLE INSUFFLATION 120MM (ENDOMECHANICALS) ×4 IMPLANT
NEEDLE SPNL 22GX7 QUINCKE BK (NEEDLE) ×3 IMPLANT
NS IRRIG 1000ML POUR BTL (IV SOLUTION) ×4 IMPLANT
OBTURATOR OPTICAL STANDARD 8MM (TROCAR) ×3
OBTURATOR OPTICAL STND 8 DVNC (TROCAR) ×2
OBTURATOR OPTICALSTD 8 DVNC (TROCAR) IMPLANT
OCCLUDER COLPOPNEUMO (BALLOONS) ×4 IMPLANT
PACK LAPAROSCOPY BASIN (CUSTOM PROCEDURE TRAY) ×4 IMPLANT
PACK TRENDGUARD 450 HYBRID PRO (MISCELLANEOUS) ×3 IMPLANT
PENCIL SMOKE EVACUATOR (MISCELLANEOUS) IMPLANT
POUCH LAPAROSCOPIC INSTRUMENT (MISCELLANEOUS) ×4 IMPLANT
PROTECTOR NERVE ULNAR (MISCELLANEOUS) ×6 IMPLANT
SCISSORS LAP 5X35 DISP (ENDOMECHANICALS) IMPLANT
SET IRRIG Y TYPE TUR BLADDER L (SET/KITS/TRAYS/PACK) ×4 IMPLANT
SET SUCTION IRRIG HYDROSURG (IRRIGATION / IRRIGATOR) ×4 IMPLANT
SET TRI-LUMEN FLTR TB AIRSEAL (TUBING) ×4 IMPLANT
SHEARS HARMONIC ACE PLUS 36CM (ENDOMECHANICALS) ×4 IMPLANT
SUT DVC VLOC 180 0 12IN GS21 (SUTURE) ×3
SUT VIC AB 0 CT1 27 (SUTURE) ×6
SUT VIC AB 0 CT1 27XBRD ANBCTR (SUTURE) ×6 IMPLANT
SUT VIC AB 4-0 PS2 18 (SUTURE) ×4 IMPLANT
SUT VICRYL 0 UR6 27IN ABS (SUTURE) IMPLANT
SUT VLOC 180 0 9IN  GS21 (SUTURE)
SUT VLOC 180 0 9IN GS21 (SUTURE) ×3 IMPLANT
SUTURE DVC VLC 180 0 12IN GS21 (SUTURE) IMPLANT
SYR 10ML LL (SYRINGE) ×3 IMPLANT
SYR 50ML LL SCALE MARK (SYRINGE) ×8 IMPLANT
SYR CONTROL 10ML LL (SYRINGE) ×1 IMPLANT
SYSTEM CARTER THOMASON II (TROCAR) IMPLANT
SYSTEM CONTND EXTRCTN KII BLLN (MISCELLANEOUS) IMPLANT
TIP UTERINE 5.1X6CM LAV DISP (MISCELLANEOUS) IMPLANT
TIP UTERINE 6.7X10CM GRN DISP (MISCELLANEOUS) ×1 IMPLANT
TIP UTERINE 6.7X6CM WHT DISP (MISCELLANEOUS) IMPLANT
TIP UTERINE 6.7X8CM BLUE DISP (MISCELLANEOUS) ×1 IMPLANT
TOWEL OR 17X26 10 PK STRL BLUE (TOWEL DISPOSABLE) ×7 IMPLANT
TRAY FOLEY W/BAG SLVR 14FR LF (SET/KITS/TRAYS/PACK) ×4 IMPLANT
TRENDGUARD 450 HYBRID PRO PACK (MISCELLANEOUS) ×3
TROCAR ADV FIXATION 5X100MM (TROCAR) ×5 IMPLANT
TROCAR BLADELESS OPT 5 100 (ENDOMECHANICALS) ×4 IMPLANT
TROCAR PORT AIRSEAL 5X120 (TROCAR) ×4 IMPLANT
TROCAR PORT AIRSEAL 8X100 (TROCAR) ×1 IMPLANT
TROCAR XCEL NON BLADE 8MM B8LT (ENDOMECHANICALS) ×4 IMPLANT
WARMER LAPAROSCOPE (MISCELLANEOUS) ×4 IMPLANT
WATER STERILE IRR 3000ML UROMA (IV SOLUTION) ×4 IMPLANT

## 2022-02-25 NOTE — Transfer of Care (Signed)
Immediate Anesthesia Transfer of Care Note  Patient: Frances Craig  Procedure(s) Performed: Procedure(s) (LRB): TOTAL LAPAROSCOPIC HYSTERECTOMY WITH SALPINGECTOMY (Bilateral) CYSTOSCOPY (N/A)  Patient Location: PACU  Anesthesia Type: General  Level of Consciousness: awake, oriented, sedated and patient cooperative  Airway & Oxygen Therapy: Patient Spontanous Breathing and Patient connected to face mask oxygen  Post-op Assessment: Report given to PACU RN and Post -op Vital signs reviewed and stable  Post vital signs: Reviewed and stable  Complications: No apparent anesthesia complications Last Vitals:  Vitals Value Taken Time  BP 126/68 02/25/22 1215  Temp 36.4 C 02/25/22 1215  Pulse 84 02/25/22 1227  Resp 19 02/25/22 1227  SpO2 100 % 02/25/22 1227  Vitals shown include unvalidated device data.  Last Pain:  Vitals:   02/25/22 1215  TempSrc:   PainSc: 3       Patients Stated Pain Goal: 6 (83/72/90 2111)  Complications: No notable events documented.

## 2022-02-25 NOTE — Anesthesia Postprocedure Evaluation (Signed)
Anesthesia Post Note  Patient: Frances Craig  Procedure(s) Performed: TOTAL LAPAROSCOPIC HYSTERECTOMY WITH SALPINGECTOMY (Bilateral) CYSTOSCOPY     Patient location during evaluation: PACU Anesthesia Type: General Level of consciousness: sedated Pain management: pain level controlled Vital Signs Assessment: post-procedure vital signs reviewed and stable Respiratory status: spontaneous breathing and respiratory function stable Cardiovascular status: stable Postop Assessment: no apparent nausea or vomiting Anesthetic complications: no   No notable events documented.  Last Vitals:  Vitals:   02/25/22 1245 02/25/22 1300  BP: 113/75 115/79  Pulse: 96 88  Resp: 12 14  Temp:  36.8 C  SpO2: 100% 100%    Last Pain:  Vitals:   02/25/22 1300  TempSrc:   PainSc: 3                  Younes Degeorge DANIEL

## 2022-02-25 NOTE — Progress Notes (Signed)
Day of Surgery Procedure(s) (LRB): TOTAL LAPAROSCOPIC HYSTERECTOMY WITH SALPINGECTOMY (Bilateral) CYSTOSCOPY (N/A)  Subjective: Patient reports some shoulder pain that has improved with heat. No nausea.  Good pain control.  Burping.  Minimal spotting.  Has voided x 2 without difficulty.  Objective: I have reviewed patient's vital signs, intake and output, medications, and labs.  Post op hb: 9.3  General: alert, cooperative, and no distress Resp: clear to auscultation bilaterally Cardio: regular pulse Vaginal Bleeding: minimal Ext:  SCDs present  Assessment: s/p Procedure(s): TOTAL LAPAROSCOPIC HYSTERECTOMY WITH SALPINGECTOMY (Bilateral) CYSTOSCOPY (N/A): stable and progressing well  Plan: Discharge home.  Pt has met all criteria for discharge including walking, voiding, good pain control, no nausea, and eaten regular diet.  LOS: 0 days    Megan Salon, MD 02/25/2022, 6:01 PM

## 2022-02-25 NOTE — Anesthesia Procedure Notes (Signed)
Procedure Name: Intubation Date/Time: 02/25/2022 8:53 AM  Performed by: Justice Rocher, CRNAPre-anesthesia Checklist: Patient identified, Emergency Drugs available, Suction available, Patient being monitored and Timeout performed Patient Re-evaluated:Patient Re-evaluated prior to induction Oxygen Delivery Method: Circle system utilized Preoxygenation: Pre-oxygenation with 100% oxygen Induction Type: IV induction, Cricoid Pressure applied and Rapid sequence Ventilation: Mask ventilation without difficulty Laryngoscope Size: Mac and 3 Grade View: Grade II Tube type: Oral Tube size: 7.0 mm Number of attempts: 1 Airway Equipment and Method: Stylet and Oral airway Placement Confirmation: ETT inserted through vocal cords under direct vision, positive ETCO2, breath sounds checked- equal and bilateral and CO2 detector Secured at: 21 cm Tube secured with: Tape Dental Injury: Teeth and Oropharynx as per pre-operative assessment

## 2022-02-25 NOTE — H&P (Signed)
Frances Craig is an 46 y.o. female G2P2 MWF with h/o enlarged uterus, menorrhagia and iron deficiency anemia that has required multiple iron transfusion and help from here hematology who is here for definitive treatment of these symptoms.  Procedures, risks, benefits and alternatives have all been discussed.  Pt is here today and ready to proceed.  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 Patient received 5 more iron infusions in 01/2022.   Cancer ([facility]) 11/06/2021   right breast cancer- lobular carcinoma & ductal carcinoma / see 11/06/21 pathology report from right breast lumpectomy in Epic   COVID-19    late 2021 or early 2022 per pt, only mild symptoms   Fibroid    GERD (gastroesophageal reflux disease)    Hx of radiation therapy    12/04/21 - 01/01/22 as of 12/31/21, radiation to right breast   Menorrhagia    Wears contact lenses    Wears glasses     Past Surgical History:  Procedure Laterality Date   BREAST LUMPECTOMY WITH RADIOACTIVE SEED LOCALIZATION Right 11/06/2021   Procedure: RIGHT BREAST LUMPECTOMY WITH RADIOACTIVE SEED LOCALIZATION;  Surgeon: Coralie Keens, MD;  Location: [facility];  Service: General;  Laterality: Right;  LMA   TUBAL LIGATION  2008    Family History  Problem Relation Age of Onset   Heart attack Mother    Skin cancer Father        BCC   Fibroids Sister    Heart attack Maternal Uncle        <50   Congestive Heart Failure Maternal Grandmother    Heart  attack Maternal Grandfather    Heart attack Paternal Grandfather     Social History:  reports that she has never smoked. She has never used smokeless tobacco. She reports that she does not drink alcohol and does not use drugs.  Allergies: Not on File  Medications Prior to Admission  Medication Sig Dispense Refill Last Dose   fluticasone (FLONASE) 50 MCG/ACT nasal spray Place into both nostrils daily.   02/25/2022 at 0430   ibuprofen (ADVIL) 800 MG tablet Take 800 mg by mouth every 8 (eight) hours as needed for mild pain.   02/23/2022   norgestimate-ethinyl estradiol (ORTHO-CYCLEN) 0.25-35 MG-MCG tablet Take 1 tablet by mouth daily. 84 tablet 0 02/24/2022   magnesium hydroxide (MILK OF MAGNESIA) 400 MG/5ML suspension Take by mouth daily as needed for mild constipation.   More than a month   tamoxifen (NOLVADEX) 20 MG tablet Take 1 tablet (20 mg total) by mouth daily. (Patient taking differently: Take 20 mg by mouth daily. Will start two weeks after surgery that is scheduled on 02/25/22.) 30 tablet 5     Review of Systems  All other systems reviewed and are negative.   Blood pressure 120/85, pulse 92, temperature 98.1 F (36.7 C), temperature source Oral, resp. rate 15, height '5\' 1"'$  (1.549 m), weight 56.1 kg, SpO2 100 %. Physical Exam Constitutional:      Appearance: Normal appearance.  Cardiovascular:  Rate and Rhythm: Normal rate.     Pulses: Normal pulses.  Pulmonary:     Effort: Pulmonary effort is normal.     Breath sounds: Normal breath sounds.  Skin:    General: Skin is warm and dry.  Neurological:     General: No focal deficit present.     Mental Status: She is alert.  Psychiatric:        Mood and Affect: Mood normal.     Results for orders placed or performed during the hospital encounter of 02/25/22 (from the past 24 hour(s))  Pregnancy, urine POC     Status: None   Collection Time: 02/25/22  6:44 AM  Result Value Ref Range   Preg Test, Ur NEGATIVE NEGATIVE     No results found.  Assessment/Plan: 46 yo G2P2 MWF with fibroid uterus, menorrhagia, iron deficiency anemia here for definitive treatment with total laparoscopic hysterectomy, bilateral salpingectomy, possible oophorectomy, cystoscopy.  Pt here and ready to proceed.  Megan Salon 02/25/2022, 8:14 AM

## 2022-02-25 NOTE — Discharge Instructions (Addendum)
Post Op Hysterectomy Instructions Please read the instructions below. Refer to these instructions for the next few weeks. These instructions provide you with general information on caring for yourself after surgery. Your caregiver may also give you specific instructions. While your treatment has been planned according to the most current medical practices available, unavoidable problems sometimes happen. If you have any problems or questions after you leave, please call your caregiver.  HOME CARE INSTRUCTIONS Healing will take time. You will have discomfort, tenderness, swelling and bruising at the operative site for a couple of weeks. This is normal and will get better as time goes on.  Only take over-the-counter or prescription medicines for pain, discomfort or fever as directed by your caregiver.  Do not take aspirin. It can cause bleeding.  Do not drive when taking pain medication.  Follow your caregiver's advice regarding diet, exercise, lifting, driving and general activities.  Resume your usual diet as directed and allowed.  Get plenty of rest and sleep.  Do not douche, use tampons, or have sexual intercourse until your caregiver gives you permission. .  Take your temperature if you feel hot or flushed.  You may shower today when you get home.  No tub bath for one week.   Do not drink alcohol until you are not taking any narcotic pain medications.  Try to have someone home with you for a week or two to help with the household activities.   Be careful over the next two to three weeks with any activities at home that involve lifting, pushing, or pulling.  Listen to your body--if something feels uncomfortable to do, then don't do it. Make sure you and your family understands everything about your operation and recovery.  Walking up stairs is fine. Do not sign any legal documents until you feel normal again.  Keep all your follow-up appointments as recommended by your caregiver.   PLEASE CALL  THE OFFICE IF: There is swelling, redness or increasing pain in the wound area.  Pus is coming from the wound.  You notice a bad smell from the wound or surgical dressing.  You have pain, redness and swelling from the intravenous site.  The wound is breaking open (the edges are not staying together).   You develop pain or bleeding when you urinate.  You develop abnormal vaginal discharge.  You have any type of abnormal reaction or develop an allergy to your medication.  You need stronger pain medication for your pain   SEEK IMMEDIATE MEDICAL CARE: You develop a temperature of 100.5 or higher.  You develop abdominal pain.  You develop chest pain.  You develop shortness of breath.  You pass out.  You develop pain, swelling or redness of your leg.  You develop heavy vaginal bleeding with or without blood clots.   MEDICATIONS: Restart your regular medications BUT wait one week before restarting all vitamins and mineral supplements Use Motrin '800mg'$  every 8 hours for the next several days.  This will help you use less Percocet.  Use the Percocet 5/325 1-2 tabs every 4-6 hours as needed for pain. You may use an over the counter stool softener like Colace or Dulcolax to help with starting a bowel movement.  Start the day after you go home.  Warm liquids, fluids, and ambulation help too.  If you have not had a bowel movement in four days, you need to call the office.  Post Anesthesia Home Care Instructions  Activity: Get plenty of rest for the remainder  of the day. A responsible individual must stay with you for 24 hours following the procedure.  For the next 24 hours, DO NOT: -Drive a car -Paediatric nurse -Drink alcoholic beverages -Take any medication unless instructed by your physician -Make any legal decisions or sign important papers.  Meals: Start with liquid foods such as gelatin or soup. Progress to regular foods as tolerated. Avoid greasy, spicy, heavy foods. If nausea and/or  vomiting occur, drink only clear liquids until the nausea and/or vomiting subsides. Call your physician if vomiting continues.  Special Instructions/Symptoms: Your throat may feel dry or sore from the anesthesia or the breathing tube placed in your throat during surgery. If this causes discomfort, gargle with warm salt water. The discomfort should disappear within 24 hours.  If you had a scopolamine patch placed behind your ear for the management of post- operative nausea and/or vomiting:  1. The medication in the patch is effective for 72 hours, after which it should be removed.  Wrap patch in a tissue and discard in the trash. Wash hands thoroughly with soap and water. 2. You may remove the patch earlier than 72 hours if you experience unpleasant side effects which may include dry mouth, dizziness or visual disturbances. 3. Avoid touching the patch. Wash your hands with soap and water after contact with the patch.

## 2022-02-25 NOTE — Op Note (Signed)
02/25/2022  12:52 PM  PATIENT:  Frances Craig  46 y.o. female  PRE-OPERATIVE DIAGNOSIS:  Menorrhagia Fibroids  POST-OPERATIVE DIAGNOSIS:  Menorrhagia Fibroids with uterus about 16 weeks size today  PROCEDURE:  Procedure(s): TOTAL LAPAROSCOPIC HYSTERECTOMY WITH SALPINGECTOMY CYSTOSCOPY  SURGEON:  Megan Salon  ASSISTANTS: Sumner Boast, MD.  An experienced assistant was required given the standard of surgical care given the complexity of the case.  This assistant was needed for exposure, dissection, suctioning, retraction, instrument exchange and for overall help during the procedure.    ANESTHESIA:   general  ESTIMATED BLOOD LOSS: 260 mL  BLOOD ADMINISTERED:none   FLUIDS: 2000 cc LR, 500cc albumin  UOP: 325cc clear UOP  SPECIMEN:  uterus, cervix, bilateral fallopian tubes  DISPOSITION OF SPECIMEN:  PATHOLOGY  FINDINGS: enlarged fibroid uterus, appendix with some adhesions present, normal upper abdomen, normal ovaries, tubes appears to be missing a portion c/w her prior tubal ligation  DESCRIPTION OF OPERATION: Patient is taken to the operating room. She is placed in the supine position. She is a running IV in place. Informed consent was present on the chart. SCDs on her lower extremities and functioning properly. Patient was positioned while she was awake.  Her legs were placed in the low lithotomy position in [place]. Her arms were tucked by the side.  General endotracheal anesthesia was administered by the anesthesia staff without difficulty. Dr. Tobias Alexander, anesthesia, oversaw case.  Time out performed.    Chlora prep was then used to prep the abdomen and Hibiclens was used to prep the inner thighs, perineum and vagina. Once 3 minutes had past the patient was draped in a normal standard fashion. The legs were lifted to the high lithotomy position. The cervix was visualized by placing a heavy weighted speculum in the posterior aspect of the vagina and using a curved  Deaver retractor to the retract anteriorly. The anterior lip of the cervix was grasped with single-tooth tenaculum.  The cervix sounded to 10 cm. Pratt dilators were used to dilate the cervix up to a #21. A RUMI uterine manipulator was obtained. A #10 disposable tip was placed on the RUMI manipulator as well as a 3.0, silver KOH ring. This was passed through the cervix and the bulb of the disposable tip was inflated with 10 cc of normal saline. With inflation of the bulb, the tip came out of the uterus some and there was not as good a fit of the KOH ring around the cervix.  The instruments were removed and a #8 tip was used.  With this, there was a good fit of the KOH ring around the cervix. The tenaculum was removed. There was limited manipulation of the uterus due to the uterine size. The speculum and retractor were removed as well. A Foley catheter was placed to straight drain.  Clear urine was noted. Legs were lowered to the low lithotomy position and attention was turned the abdomen.  The umbilicus was everted.  A Veress needle was obtained. Syringe of sterile saline was placed on a open Veress needle.  This was passed into the umbilicus until just when the fluid started to drip.  Then low flow CO2 gas was attached the needle and the pneumoperitoneum was achieved without difficulty. Once four liters of gas was in the abdomen the Veress needle was removed and a 5 millimeter non-bladed Optiview trocar and port were passed directly to the abdomen. The laparoscope was then used to confirm intraperitoneal placement. Findings included grossly enlarged  uterus c/w fibroids, normal ovaries.  Locations for RLQ, LLQ, and suprapubic ports were noted by transillumination of the abdominal wall.  0.25% marcaine was used to anesthetize the skin.  59m skin incision was made in the RLQ and an AirSeal port was placed underdirect visualization of the laparoscope.  Then a 54mskin incision was made and a 22m91monbladed trochar and  port was placed in the LLQ.  Finally, and 8mm58min incision was made about 4cm above the pubic symphasis and an 8mm 73m-bladed port was placed with direct visualization of the laparoscope.  All trochars were removed.    Ureters were identifies.  Attention was turned to the left side. With uterus on stretch the left tube was excised off the ovary and mesosalpinx was dissected to free the tube. Then the left utero-ovarian pedicle was serially clamped cauterized and incised using the ligasure device. Left round ligament was serially clamped cauterized and incised. The anterior and posterior peritoneum of the inferior leaf of the broad ligament were opened. The beginning of the bladder flap was created.  The bladder was taken down below the level of the KOH ring. The left uterine artery skeletonized and then just superior to the KOH ring this vessel was clamped and cauterized.  Attention was turned the right side.  The uterus was placed on stretch to the opposite side.  The tube was excised off the ovary using sharp dissection a bipolar cautery.  The mesosalpinx was incised freeing the tube. Then the right uterine ovarian pedicle was serially clamped cauterized and incised. Next the right round ligament was serially clamped cauterized and incised. The anterior posterior peritoneum of the inferiorly for the broad ligament were opened. The anterior peritoneum was carried across to the dissection on the left side. The remainder of the bladder flap was created using sharp dissection. The bladder was well below the level of the KOH ring. The left uterine artery skeletonized. Then the left uterine artery, above the level of the KOH ring, was serially clamped cauterized and incised. There was some back bleeding at this point.  Attention was turned back to the left size and uterine artery was clamped, cauterized and incised.  The uterus was devascularized at this point.  The colpotomy was performed a starting in the  midline and using a harmonic scalpel with the inferior edge of the open blade  This was carried around a circumferential fashion until the vaginal mucosa was completely incised in the specimen was freed.  The specimen was too large to delivery through the vagina.  It was placed in a #27 Alexsis bag and then the bag was brought down into the vagina.  A bonano retractor was placed in the vagina.  The uterus was then morcellated in the bag.  The uterine weight was 799grams after it was removed.  The bag was delivered through the vagina.  The bag was intact.  Then a vaginal occlusive device was used to maintain the pneumoperitoneum  Instruments were changed with a needle driver and Kobra graspers.  Using a 9 inch V. lock suture, the cuff was closed by incorporating the anterior and posterior vaginal mucosa in each stitch. This was carried across all the way to the left corner and a running fashion. Two stitches were brought back towards the midline and the suture was cut flush with the vagina. The needle was brought out the pelvis. The pelvis was irrigated. All pedicles were inspected. No bleeding was noted.   Co2 pressures were  lowered to 25m Hg.  Again, no bleeding was noted.  Ureters were noted deep in the pelvis to be peristalsing.  Arista was placed along the pedicles.  At this point the procedure was completed.  The remaining instruments were removed.  The ports (except the suprapubic port) were removed under direct visualization of the laparoscope and the pneumoperitoneum was relieved.  The patient was taken out of Trendelenburg positioning.  Several deep breaths were given to the patient's trying to any gas the abdomen and finally the suprapubic port was removed.  The skin was then closed with subcuticular stitches of 3-0 Vicryl. The skin was cleansed Dermabond was applied. Attention was then turned the vagina and the cuff was inspected. No bleeding was noted. The anterior posterior vaginal mucosa was  incorporated in each stitch. The Foley catheter was removed.  Cystoscopy was performed.  No sutures or bladder injuries were noted.  Ureters were noted with normal urine jets from each one was seen.  Foley was left out after the cystoscopic fluid was drained and cystoscope removed.  Sponge, lap, needle, initially counts were correct x2. Patient tolerated the procedure very well. She was awakened from anesthesia, extubated and taken to recovery in stable condition.    COUNTS:  YES  PLAN OF CARE: Transfer to PACU

## 2022-02-26 ENCOUNTER — Encounter: Payer: Self-pay | Admitting: Hematology and Oncology

## 2022-02-26 ENCOUNTER — Encounter (HOSPITAL_BASED_OUTPATIENT_CLINIC_OR_DEPARTMENT_OTHER): Payer: Self-pay | Admitting: Obstetrics & Gynecology

## 2022-02-26 LAB — SURGICAL PATHOLOGY

## 2022-02-27 ENCOUNTER — Encounter: Payer: Self-pay | Admitting: Hematology and Oncology

## 2022-03-04 ENCOUNTER — Encounter (HOSPITAL_BASED_OUTPATIENT_CLINIC_OR_DEPARTMENT_OTHER): Payer: Self-pay | Admitting: Obstetrics & Gynecology

## 2022-03-05 ENCOUNTER — Telehealth (INDEPENDENT_AMBULATORY_CARE_PROVIDER_SITE_OTHER): Payer: 59 | Admitting: Obstetrics & Gynecology

## 2022-03-05 ENCOUNTER — Encounter (HOSPITAL_BASED_OUTPATIENT_CLINIC_OR_DEPARTMENT_OTHER): Payer: Self-pay | Admitting: Obstetrics & Gynecology

## 2022-03-05 DIAGNOSIS — T8149XA Infection following a procedure, other surgical site, initial encounter: Secondary | ICD-10-CM

## 2022-03-05 DIAGNOSIS — Z9889 Other specified postprocedural states: Secondary | ICD-10-CM

## 2022-03-05 MED ORDER — TRIAMCINOLONE ACETONIDE 0.5 % EX OINT: 1.0000 | TOPICAL_OINTMENT | Freq: Three times a day (TID) | CUTANEOUS | 0 refills | Status: DC

## 2022-03-05 NOTE — Progress Notes (Signed)
Virtual Visit via Video Note  I connected with Frances Craig on 03/05/22 at  9:30 AM EDT by a video enabled telemedicine application and verified that I am speaking with the correct person using two identifiers.  Location: Patient: home Provider: office   I discussed the limitations of evaluation and management by telemedicine and the availability of in person appointments. The patient expressed understanding and agreed to proceed.  History of Present Illness: 46  yo G2P2 having a virtual visit for 1 week post op visit after having TLH/bilateral salpingectomy/cystoscopy.  She reports she is doing well.  Hasn't taken any narcotics post op.  Only taking motrin at night.  Denies fever.  Having bowel movements.  Voiding normally.  Not having to get up at night.  Denies vaginal bleeding.  Is having some itching around her incisions.  She is using topical benadryl and this is helping.  Wanted to know if anything else should be used.  She did send picture via mychart.   Observations/Objective: WNWD WF, NAD  Assessment and Plan: 65 G2P2 MWF post op from TLH/bilateral salpingectomy, cystoscopy with some itching at incisions.  1. Post-operative state - has second post op check on 8/17  2. Inflammation of operative incision - anti-histamines discussed.  Pt doesn't want to take anything orally yet - triamcinolone ointment (KENALOG) 0.5 %; Apply 1 Application topically 3 (three) times daily.  Dispense: 30 g; Refill: 0   Follow Up Instructions: I discussed the assessment and treatment plan with the patient. The patient was provided an opportunity to ask questions and all were answered. The patient agreed with the plan and demonstrated an understanding of the instructions.   The patient was advised to call back or seek an in-person evaluation if the symptoms worsen or if the condition fails to improve as anticipated.  I provided 11 minutes of non-face-to-face time during this  encounter.   Megan Salon, MD

## 2022-03-12 ENCOUNTER — Telehealth (HOSPITAL_BASED_OUTPATIENT_CLINIC_OR_DEPARTMENT_OTHER): Payer: No Typology Code available for payment source | Admitting: Obstetrics & Gynecology

## 2022-03-13 ENCOUNTER — Encounter (HOSPITAL_BASED_OUTPATIENT_CLINIC_OR_DEPARTMENT_OTHER): Payer: Self-pay | Admitting: Obstetrics & Gynecology

## 2022-03-30 ENCOUNTER — Encounter: Payer: Self-pay | Admitting: Hematology and Oncology

## 2022-03-30 ENCOUNTER — Encounter: Payer: Self-pay | Admitting: *Deleted

## 2022-04-01 ENCOUNTER — Encounter: Payer: Self-pay | Admitting: Hematology and Oncology

## 2022-04-01 ENCOUNTER — Inpatient Hospital Stay: Payer: Commercial Managed Care - HMO | Attending: Genetic Counselor | Admitting: Adult Health

## 2022-04-01 ENCOUNTER — Other Ambulatory Visit: Payer: Self-pay

## 2022-04-01 VITALS — BP 113/81 | HR 116 | Temp 98.1°F | Resp 18 | Ht 61.0 in | Wt 118.5 lb

## 2022-04-01 DIAGNOSIS — D0511 Intraductal carcinoma in situ of right breast: Secondary | ICD-10-CM | POA: Diagnosis present

## 2022-04-01 DIAGNOSIS — Z1211 Encounter for screening for malignant neoplasm of colon: Secondary | ICD-10-CM | POA: Diagnosis not present

## 2022-04-01 DIAGNOSIS — Z170 Estrogen receptor positive status [ER+]: Secondary | ICD-10-CM | POA: Diagnosis not present

## 2022-04-01 DIAGNOSIS — Z79810 Long term (current) use of selective estrogen receptor modulators (SERMs): Secondary | ICD-10-CM | POA: Diagnosis not present

## 2022-04-01 DIAGNOSIS — N920 Excessive and frequent menstruation with regular cycle: Secondary | ICD-10-CM | POA: Diagnosis present

## 2022-04-01 DIAGNOSIS — D500 Iron deficiency anemia secondary to blood loss (chronic): Secondary | ICD-10-CM | POA: Insufficient documentation

## 2022-04-01 NOTE — Progress Notes (Signed)
Pt is taking Tamoxifen as prescribed with no side effects.

## 2022-04-01 NOTE — Progress Notes (Signed)
SURVIVORSHIP VISIT:   BRIEF ONCOLOGIC HISTORY:  Oncology History  Ductal carcinoma in situ (DCIS) of right breast  11/20/2021 Initial Diagnosis   Ductal carcinoma in situ (DCIS) of right breast   12/02/2021 Genetic Testing   Negative genetic testing on the CancerNext-Expanded+RNAinsight panel.  The report date is 12/02/2021.  The CancerNext-Expanded gene panel offered by [facility] and includes sequencing and rearrangement analysis for the following 77 genes: AIP, ALK, APC*, ATM*, AXIN2, BAP1, BARD1, BLM, BMPR1A, BRCA1*, BRCA2*, BRIP1*, CDC73, CDH1*, CDK4, CDKN1B, CDKN2A, CHEK2*, CTNNA1, DICER1, FANCC, FH, FLCN, GALNT12, KIF1B, LZTR1, MAX, MEN1, MET, MLH1*, MSH2*, MSH3, MSH6*, MUTYH*, NBN, NF1*, NF2, NTHL1, PALB2*, PHOX2B, PMS2*, POT1, PRKAR1A, PTCH1, PTEN*, RAD51C*, RAD51D*, RB1, RECQL, RET, SDHA, SDHAF2, SDHB, SDHC, SDHD, SMAD4, SMARCA4, SMARCB1, SMARCE1, STK11, SUFU, TMEM127, TP53*, TSC1, TSC2, VHL and XRCC2 (sequencing and deletion/duplication); EGFR, EGLN1, HOXB13, KIT, MITF, PDGFRA, POLD1, and POLE (sequencing only); EPCAM and GREM1 (deletion/duplication only). DNA and RNA analyses performed for * genes.    12/03/2021 - 01/01/2022 Radiation Therapy     02/2022 -  Anti-estrogen oral therapy   Tamoxifen     INTERVAL HISTORY:  Frances Craig to review her survivorship care plan detailing her treatment course for breast cancer, as well as monitoring long-term side effects of that treatment, education regarding health maintenance, screening, and overall wellness and health promotion.     Overall, Frances Craig reports feeling quite well.  She is taking tamoxifen daily with good tolerance.  She denies any significant concerns today other than feeling a sort of survivors guilt from having  stage 0 breast cancer.    REVIEW OF SYSTEMS:  Review of Systems  Constitutional:  Negative for appetite change, chills, fatigue, fever and unexpected weight change.  HENT:   Negative for hearing loss, lump/mass  and trouble swallowing.   Eyes:  Negative for eye problems and icterus.  Respiratory:  Negative for chest tightness, cough and shortness of breath.   Cardiovascular:  Negative for chest pain, leg swelling and palpitations.  Gastrointestinal:  Negative for abdominal distention, abdominal pain, constipation, diarrhea, nausea and vomiting.  Endocrine: Negative for hot flashes.  Genitourinary:  Negative for difficulty urinating.   Musculoskeletal:  Negative for arthralgias.  Skin:  Negative for itching and rash.  Neurological:  Negative for dizziness, extremity weakness, headaches and numbness.  Hematological:  Negative for adenopathy. Does not bruise/bleed easily.  Psychiatric/Behavioral:  Negative for depression. The patient is not nervous/anxious.    Breast: Denies any new nodularity, masses, tenderness, nipple changes, or nipple discharge.      ONCOLOGY TREATMENT TEAM:  1. Surgeon:  Dr. Ninfa Linden at [facility] 2. Medical Oncologist: Dr. Chryl Heck  3. Radiation Oncologist: Dr. Baruch Gouty    PAST MEDICAL/SURGICAL HISTORY:  Past Medical History:  Diagnosis Date   Anemia 2022   chronic anemia, 10/28/21 Hgb 7.9 in Epic, 12/29/21 Hgb 8.9 / Patient has been receiving weekly iron infusions, 12/15/21 - 12/29/21. Patient received 5 more iron infusions in 01/2022.   Cancer ([place]) 11/06/2021   right breast cancer- lobular carcinoma & ductal carcinoma / see 11/06/21 pathology report from right breast lumpectomy in Epic   COVID-19    late 2021 or early 2022 per pt, only mild symptoms   Fibroid    GERD (gastroesophageal reflux disease)    Hx of radiation therapy    12/04/21 - 01/01/22 as of 12/31/21, radiation to right breast   Menorrhagia    Wears contact lenses    Wears glasses  Past Surgical History:  Procedure Laterality Date   BREAST LUMPECTOMY WITH RADIOACTIVE SEED LOCALIZATION Right 11/06/2021   Procedure: RIGHT BREAST LUMPECTOMY WITH RADIOACTIVE SEED LOCALIZATION;  Surgeon:  Coralie Keens, MD;  Location: [place];  Service: General;  Laterality: Right;  LMA   CYSTOSCOPY N/A 02/25/2022   Procedure: Consuela Mimes;  Surgeon: Megan Salon, MD;  Location: [facility];  Service: Gynecology;  Laterality: N/A;   TOTAL LAPAROSCOPIC HYSTERECTOMY WITH SALPINGECTOMY Bilateral 02/25/2022   Procedure: TOTAL LAPAROSCOPIC HYSTERECTOMY WITH SALPINGECTOMY;  Surgeon: Megan Salon, MD;  Location: [facility];  Service: Gynecology;  Laterality: Bilateral;   TUBAL LIGATION  2008     ALLERGIES:  Not on File   CURRENT MEDICATIONS:  Outpatient Encounter Medications as of 04/01/2022  Medication Sig   fluticasone (FLONASE) 50 MCG/ACT nasal spray Place into both nostrils daily.   ibuprofen (ADVIL) 800 MG tablet Take 1 tablet by mouth every 8 (eight) hours as needed for mild pain, moderate pain or cramping.   tamoxifen (NOLVADEX) 20 MG tablet Take 1 tablet (20 mg total) by mouth daily. (Patient taking differently: Take 20 mg by mouth daily. Started 03/11/22)   [DISCONTINUED] triamcinolone ointment (KENALOG) 0.5 % Apply 1 Application topically 3 (three) times daily. (Patient not taking: Reported on 04/01/2022)   No facility-administered encounter medications on file as of 04/01/2022.     ONCOLOGIC FAMILY HISTORY:  Family History  Problem Relation Age of Onset   Heart attack Mother    Skin cancer Father        BCC   Fibroids Sister    Heart attack Maternal Uncle        <50   Congestive Heart Failure Maternal Grandmother    Heart attack Maternal Grandfather    Heart attack Paternal Grandfather      SOCIAL HISTORY:  Social History   Socioeconomic History   Marital status: Married    Spouse name: Not on file   Number of children: 2   Years of education: Not on file   Highest education level: High school graduate  Occupational History   Not on file  Tobacco Use   Smoking status: Never   Smokeless tobacco: Never  Vaping Use   Vaping Use:  Never used  Substance and Sexual Activity   Alcohol use: Never   Drug use: Never   Sexual activity: Yes    Birth control/protection: Pill, Surgical    Comment: tubal ligation  Other Topics Concern   Not on file  Social History Narrative   Not on file   Social Determinants of Health   Financial Resource Strain: Not on file  Food Insecurity: No Food Insecurity (08/05/2021)   Hunger Vital Sign    Worried About Running Out of Food in the Last Year: Never true    Ran Out of Food in the Last Year: Never true  Transportation Needs: No Transportation Needs (08/05/2021)   PRAPARE - Hydrologist (Medical): No    Lack of Transportation (Non-Medical): No  Physical Activity: Not on file  Stress: Not on file  Social Connections: Not on file  Intimate Partner Violence: Not At Risk (11/20/2021)   Humiliation, Afraid, Rape, and Kick questionnaire    Fear of Current or Ex-Partner: No    Emotionally Abused: No    Physically Abused: No    Sexually Abused: No     OBSERVATIONS/OBJECTIVE:  BP 113/81 (BP Location: Left Arm, Patient Position: Sitting)   Pulse Marland Kitchen)  116 Comment: Lindesy Nefi Musich notified-pt states she is nervous  Temp 98.1 F (36.7 C) (Temporal)   Resp 18   Ht _0  (1.549 m)   Wt 118 lb 8 oz (53.8 kg)   SpO2 100%   BMI 22.39 kg/m   GENERAL: Patient is a well appearing female in no acute distress HEENT:  Sclerae anicteric.  Oropharynx clear and moist. No ulcerations or evidence of oropharyngeal candidiasis. Neck is supple.  NODES:  No cervical, supraclavicular, or axillary lymphadenopathy palpated.  BREAST EXAM:  Deferred. LUNGS:  Clear to auscultation bilaterally.  No wheezes or rhonchi. HEART:  Regular rate and rhythm. No murmur appreciated. ABDOMEN:  Soft, nontender.  Positive, normoactive bowel sounds. No organomegaly palpated. MSK:  No focal spinal tenderness to palpation. Full range of motion bilaterally in the upper extremities. EXTREMITIES:   No peripheral edema.   SKIN:  Clear with no obvious rashes or skin changes. No nail dyscrasia. NEURO:  Nonfocal. Well oriented.  Appropriate affect.   LABORATORY DATA:  None for this visit.  DIAGNOSTIC IMAGING:  None for this visit.      ASSESSMENT AND PLAN:  Frances Craig is a pleasant 46 y.o. female with Stage 0 right breast DCIS, ER+/PR+, diagnosed in 08/2021, treated with lumpectomy, adjuvant radiation therapy, and anti-estrogen therapy with Tamoxifen beginning in 02/2022.  She presents to the Survivorship Clinic for our initial meeting and routine follow-up post-completion of treatment for breast cancer.    1. Stage 0 right breast cancer:  Frances Craig is continuing to recover from definitive treatment for breast cancer. She will follow-up with her medical oncologist, Dr. Chryl Heck in 07/2022 with history and physical exam per surveillance protocol.  She will continue her anti-estrogen therapy with Tamoxifen. Thus far, she is tolerating the Tamoxifen well, with minimal side effects. Her mammogram is due 07/2022; orders placed today.. Today, a comprehensive survivorship care plan and treatment summary was reviewed with the patient today detailing her breast cancer diagnosis, treatment course, potential late/long-term effects of treatment, appropriate follow-up care with recommendations for the future, and patient education resources.  A copy of this summary, along with a letter will be sent to the patient's primary care provider via mail/fax/In Basket message after today's visit.    2. Bone health:  She was given education on specific activities to promote bone health.  3. Cancer screening:  Due to Frances Craig's history and her age, she should receive screening for skin cancers, colon cancer, and gynecologic cancers.  The information and recommendations are listed on the patient's comprehensive care plan/treatment summary and were reviewed in detail with the patient.    4. Health maintenance and  wellness promotion: Frances Craig was encouraged to consume 5-7 servings of fruits and vegetables per day. We reviewed the "Nutrition Rainbow" handout.  She was also encouraged to engage in moderate to vigorous exercise for 30 minutes per day most days of the week. We discussed the [employer] fitness program, which is designed for cancer survivors to help them become more physically fit after cancer treatments.  She was instructed to limit her alcohol consumption and continue to abstain from tobacco use.     5. Support services/counseling: It is not uncommon for this period of the patient's cancer care trajectory to be one of many emotions and stressors.  She was given information regarding our available services and encouraged to contact me with any questions or for help enrolling in any of our support group/programs.    Follow up instructions:    -  Return to cancer center in 07/2022 for f/u with Dr. Chryl Heck  -Mammogram due in 07/2023 -Referral to GI -She is welcome to return back to the Survivorship Clinic at any time; no additional follow-up needed at this time.  -Consider referral back to survivorship as a long-term survivor for continued surveillance  The patient was provided an opportunity to ask questions and all were answered. The patient agreed with the plan and demonstrated an understanding of the instructions.  Total encounter time:45 minutes*in face-to-face visit time, chart review, lab review, care coordination, order entry, and documentation of the encounter time.    Wilber Bihari, NP 04/01/22 10:01 AM Medical Oncology and Hematology [facility] [place], [place] [address] Tel. [phone]    Fax. [phone]  *Total Encounter Time as defined by the Centers for Medicare and Medicaid Services includes, in addition to the face-to-face time of a patient visit (documented in the note above) non-face-to-face time: obtaining and reviewing outside  history, ordering and reviewing medications, tests or procedures, care coordination (communications with other health care professionals or caregivers) and documentation in the medical record.

## 2022-04-02 ENCOUNTER — Encounter (HOSPITAL_BASED_OUTPATIENT_CLINIC_OR_DEPARTMENT_OTHER): Payer: Self-pay | Admitting: Obstetrics & Gynecology

## 2022-04-02 ENCOUNTER — Other Ambulatory Visit: Payer: Self-pay | Admitting: Adult Health

## 2022-04-02 ENCOUNTER — Ambulatory Visit (INDEPENDENT_AMBULATORY_CARE_PROVIDER_SITE_OTHER): Payer: Commercial Managed Care - HMO | Admitting: Obstetrics & Gynecology

## 2022-04-02 VITALS — BP 121/79 | HR 76 | Ht 61.0 in | Wt 119.2 lb

## 2022-04-02 DIAGNOSIS — D500 Iron deficiency anemia secondary to blood loss (chronic): Secondary | ICD-10-CM

## 2022-04-02 DIAGNOSIS — Z9889 Other specified postprocedural states: Secondary | ICD-10-CM

## 2022-04-02 DIAGNOSIS — D0511 Intraductal carcinoma in situ of right breast: Secondary | ICD-10-CM

## 2022-04-02 DIAGNOSIS — Z1211 Encounter for screening for malignant neoplasm of colon: Secondary | ICD-10-CM

## 2022-04-02 NOTE — Progress Notes (Signed)
GYNECOLOGY  VISIT  CC:   post op recheck  HPI: 46 y.o. G2P2002 [name] here for recheck after undergoing TLH/bilateral salpingectomy/cystoscopy on 02/25/2022.  She reports bleeding is none.  She has no pain.  She only took ibuprofen and tylenol.  Never took narcotics.  Bowel function is Normal.  Bladder function is normal.    Requests referral to GI for screening colonoscopy after birthday.     MEDS:   Current Outpatient Medications on File Prior to Visit  Medication Sig Dispense Refill   fluticasone (FLONASE) 50 MCG/ACT nasal spray Place into both nostrils daily.     ibuprofen (ADVIL) 800 MG tablet Take 1 tablet by mouth every 8 (eight) hours as needed for mild pain, moderate pain or cramping. 30 tablet 0   tamoxifen (NOLVADEX) 20 MG tablet Take 1 tablet (20 mg total) by mouth daily. (Patient taking differently: Take 20 mg by mouth daily. Started 03/11/22) 30 tablet 5   No current facility-administered medications on file prior to visit.    SH:  Smoking No    PHYSICAL EXAMINATION:    BP 121/79 (BP Location: Right Arm, Patient Position: Sitting, Cuff Size: Normal)   Pulse 76   Ht '5\' 1"'$  (1.549 m) Comment: Reported  Wt 119 lb 3.2 oz (54.1 kg)   LMP 07/16/2021 (Exact Date)   BMI 22.52 kg/m     General appearance: alert, cooperative and appears stated age CV:  Regular rate and rhythm Lungs:  clear to auscultation, no wheezes, rales or rhonchi, symmetric air entry Abdomen: soft, non-tender; bowel sounds normal; no masses,  no organomegaly Incisions:  C/D/I  Pelvic: External genitalia:  no lesions              Urethra:  normal appearing urethra with no masses, tenderness or lesions              Bartholins and Skenes: normal                 Vagina: normal appearing vagina with normal color and discharge, no lesions              Cervix: absent              Bimanual Exam:  Uterus:  uterus absent              Adnexa: no mass, fullness,  tenderness  Chaperone, Octaviano Batty, CMA, was present for exam.  Assessment/Plan: 1. Post-operative state - pt released to resume normal activities.  Pt reminded about nothing in vagina for 12 weeks post op  2. Iron deficiency anemia due to chronic blood loss - CBC - Iron, TIBC and Ferritin Panel  3. Colon cancer screening - Ambulatory referral to Gastroenterology

## 2022-04-03 ENCOUNTER — Encounter: Payer: Self-pay | Admitting: Hematology and Oncology

## 2022-04-03 LAB — CBC
Hematocrit: 39 % (ref 34.0–46.6)
Hemoglobin: 12.9 g/dL (ref 11.1–15.9)
MCH: 27 pg (ref 26.6–33.0)
MCHC: 33.1 g/dL (ref 31.5–35.7)
MCV: 82 fL (ref 79–97)
Platelets: 338 10*3/uL (ref 150–450)
RBC: 4.78 x10E6/uL (ref 3.77–5.28)
RDW: 15.7 % — ABNORMAL HIGH (ref 11.7–15.4)
WBC: 3.9 10*3/uL (ref 3.4–10.8)

## 2022-04-03 LAB — IRON,TIBC AND FERRITIN PANEL
Ferritin: 26 ng/mL (ref 15–150)
Iron Saturation: 13 % — ABNORMAL LOW (ref 15–55)
Iron: 51 ug/dL (ref 27–159)
Total Iron Binding Capacity: 396 ug/dL (ref 250–450)
UIBC: 345 ug/dL (ref 131–425)

## 2022-04-10 ENCOUNTER — Encounter: Payer: Self-pay | Admitting: Gastroenterology

## 2022-04-27 ENCOUNTER — Encounter (HOSPITAL_BASED_OUTPATIENT_CLINIC_OR_DEPARTMENT_OTHER): Payer: Self-pay | Admitting: Obstetrics & Gynecology

## 2022-04-27 ENCOUNTER — Ambulatory Visit (INDEPENDENT_AMBULATORY_CARE_PROVIDER_SITE_OTHER): Payer: Commercial Managed Care - HMO | Admitting: Obstetrics & Gynecology

## 2022-04-27 VITALS — BP 128/77 | HR 86 | Ht 61.0 in | Wt 117.0 lb

## 2022-04-27 DIAGNOSIS — T8131XA Disruption of external operation (surgical) wound, not elsewhere classified, initial encounter: Secondary | ICD-10-CM

## 2022-04-27 DIAGNOSIS — G8918 Other acute postprocedural pain: Secondary | ICD-10-CM

## 2022-04-27 MED ORDER — LEVOFLOXACIN 750 MG PO TABS: 750.0000 mg | ORAL_TABLET | Freq: Every day | ORAL | 0 refills | Status: DC

## 2022-04-27 MED ORDER — METRONIDAZOLE 500 MG PO TABS: 500.0000 mg | ORAL_TABLET | Freq: Three times a day (TID) | ORAL | 0 refills | Status: DC

## 2022-04-27 NOTE — Progress Notes (Signed)
GYNECOLOGY  VISIT  CC:   post op recheck  HPI: 46 y.o. G48P2002 [name] here for recheck after having intercourse yesterday.  Pt had pain and bleeding.  Is about 8 weeks post op and was advised to have pelvic rest for a full 12 weeks so did have intercourse early.  Has not had any vaginal bleeding or discharge today.  Having some cramping but not really taking anything for it.   MEDS:   Current Outpatient Medications on File Prior to Visit  Medication Sig Dispense Refill   fluticasone (FLONASE) 50 MCG/ACT nasal spray Place into both nostrils daily.     ibuprofen (ADVIL) 800 MG tablet Take 1 tablet by mouth every 8 (eight) hours as needed for mild pain, moderate pain or cramping. 30 tablet 0   tamoxifen (NOLVADEX) 20 MG tablet Take 1 tablet (20 mg total) by mouth daily. (Patient taking differently: Take 20 mg by mouth daily. Started 03/11/22) 30 tablet 5   No current facility-administered medications on file prior to visit.    SH:  Smoking No    PHYSICAL EXAMINATION:    BP 128/77   Pulse 86   Ht '5\' 1"'$  (1.549 m)   Wt 117 lb (53.1 kg)   LMP 07/16/2021 (Exact Date)   BMI 22.11 kg/m     General appearance: alert, cooperative and appears stated age  Pelvic: External genitalia:  no lesions              Urethra:  normal appearing urethra with no masses, tenderness or lesions              Bartholins and Skenes: normal                 Vagina: blue V lock suture is still present, cuff has partial thickness tear but does not appear to have any full thickness tear.  Cuff probed carefully but completely.  Chaperone, Octaviano Batty, CMA, was present for exam.  Assessment/Plan: 1. Post-operative pain and partial thickness cuff tear - pelvic rest and no heavy lifting HIGHLY encouraged.  Will likely need pelvic rest for longer than initial 12 weeks but will plan to recheck in 2 weeks.  Will start levoquin and flagyl.  Advised to call with any new  concerns/questions.

## 2022-05-11 ENCOUNTER — Ambulatory Visit (INDEPENDENT_AMBULATORY_CARE_PROVIDER_SITE_OTHER): Payer: Commercial Managed Care - HMO | Admitting: Obstetrics & Gynecology

## 2022-05-11 VITALS — BP 106/78 | HR 66 | Wt 115.0 lb

## 2022-05-11 DIAGNOSIS — T8131XA Disruption of external operation (surgical) wound, not elsewhere classified, initial encounter: Secondary | ICD-10-CM

## 2022-05-11 DIAGNOSIS — Z9889 Other specified postprocedural states: Secondary | ICD-10-CM

## 2022-05-11 DIAGNOSIS — T8131XD Disruption of external operation (surgical) wound, not elsewhere classified, subsequent encounter: Secondary | ICD-10-CM

## 2022-05-16 ENCOUNTER — Encounter (HOSPITAL_BASED_OUTPATIENT_CLINIC_OR_DEPARTMENT_OTHER): Payer: Self-pay | Admitting: Obstetrics & Gynecology

## 2022-05-16 NOTE — Progress Notes (Signed)
GYNECOLOGY  VISIT  CC:   post op recheck  HPI: 46 y.o. G2P2002 [name] here for recheck after undergoing TLH/bilateral salpingectomy/cystoscopy on 02/25/2022.  She had intercourse early and had partial thickness tear of vaginal cuff.  She was started on antibiotics which did cause some GI distress.  She did take them all.  Reports bleeding has resolved.  Is having some mild discharge but there is no odor.  Has not been SA.  Has had some cramping but hasn't really needed much for pain.  Reports normal bowel and bladder function.  MEDS:   Current Outpatient Medications on File Prior to Visit  Medication Sig Dispense Refill   fluticasone (FLONASE) 50 MCG/ACT nasal spray Place into both nostrils daily.     ibuprofen (ADVIL) 800 MG tablet Take 1 tablet by mouth every 8 (eight) hours as needed for mild pain, moderate pain or cramping. 30 tablet 0   levofloxacin (LEVAQUIN) 750 MG tablet Take 1 tablet (750 mg total) by mouth daily. 14 tablet 0   metroNIDAZOLE (FLAGYL) 500 MG tablet Take 1 tablet (500 mg total) by mouth 3 (three) times daily. 42 tablet 0   tamoxifen (NOLVADEX) 20 MG tablet Take 1 tablet (20 mg total) by mouth daily. (Patient taking differently: Take 20 mg by mouth daily. Started 03/11/22) 30 tablet 5   No current facility-administered medications on file prior to visit.    SH:  Smoking No    PHYSICAL EXAMINATION:    BP 106/78   Pulse 66   Wt 115 lb (52.2 kg)   LMP 07/16/2021 (Exact Date)   BMI 21.73 kg/m     General appearance: alert, cooperative and appears stated age Inc:  C/D/I  Pelvic: External genitalia:  no lesions              Urethra:  normal appearing urethra with no masses, tenderness or lesions              Bartholins and Skenes: normal                 Vagina: normal appearing vagina with normal color and discharge, no lesions, cuff well approximated, cannot see any sutures              Cervix: absent              Bimanual Exam:   Uterus:  uterus absent, can feel sutures with digital exam                Assessment/Plan: 1. Post-operative state - recheck 4 weeks.  Pelvic rest highly recommended  2. Vaginal cuff dehiscence, subsequent encounter (partial thickness) - appears to be healing well

## 2022-06-04 ENCOUNTER — Ambulatory Visit: Payer: Commercial Managed Care - HMO | Admitting: Gastroenterology

## 2022-06-04 ENCOUNTER — Encounter: Payer: Self-pay | Admitting: Gastroenterology

## 2022-06-04 VITALS — BP 112/64 | Ht 61.0 in | Wt 114.4 lb

## 2022-06-04 DIAGNOSIS — Z1211 Encounter for screening for malignant neoplasm of colon: Secondary | ICD-10-CM

## 2022-06-04 DIAGNOSIS — D509 Iron deficiency anemia, unspecified: Secondary | ICD-10-CM

## 2022-06-04 DIAGNOSIS — D0511 Intraductal carcinoma in situ of right breast: Secondary | ICD-10-CM

## 2022-06-04 NOTE — Patient Instructions (Addendum)
You will be due for a recall colonoscopy in January. We will send you a reminder in the mail when it gets closer to that time.    If you are age 45 or younger, your body mass index should be between 19-25. Your Body mass index is 21.62 kg/m. If this is out of the aformentioned range listed, please consider follow up with your Primary Care Provider.   __________________________________________________________  The [facility] providers would like to encourage you to use [facility] to communicate with providers for non-urgent requests or questions.  Due to long hold times on the telephone, sending your provider a message by [facility] may be a faster and more efficient way to get a response.  Please allow 48 business hours for a response.  Please remember that this is for non-urgent requests.   Due to recent changes in healthcare laws, you may see the results of your imaging and laboratory studies on MyChart before your provider has had a chance to review them.  We understand that in some cases there may be results that are confusing or concerning to you. Not all laboratory results come back in the same time frame and the provider may be waiting for multiple results in order to interpret others.  Please give [place] 48 hours in order for your provider to thoroughly review all the results before contacting the office for clarification of your results.     Thank you for choosing me and [facility].  Vito Cirigliano, D.O.

## 2022-06-04 NOTE — Progress Notes (Signed)
Chief Complaint: Discuss colon cancer screening   Referring Provider:     Megan Salon, MD   HPI:     Frances Craig is a 46 y.o. 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  No previous colonoscopy.  No GI symptoms.  No known family history of colon cancer or related malignancy.  Does have a history of iron deficiency anemia 2/2 fibroids and required IV iron and TLH/BSO in July.  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 Patient received 5 more iron infusions in 01/2022.   Hx of radiation therapy    12/04/21 - 01/01/22 as of 12/31/21, radiation to right breast   Wears contact lenses    Wears glasses      Past Surgical History:  Procedure Laterality Date   BREAST LUMPECTOMY WITH RADIOACTIVE SEED LOCALIZATION Right 11/06/2021   Procedure: RIGHT BREAST LUMPECTOMY WITH RADIOACTIVE SEED LOCALIZATION;  Surgeon: Coralie Keens, MD;  Location: [facility];  Service: General;  Laterality: Right;  LMA   CYSTOSCOPY N/A 02/25/2022   Procedure: Consuela Mimes;  Surgeon: Megan Salon, MD;  Location: [facility];  Service: Gynecology;  Laterality: N/A;   TOTAL LAPAROSCOPIC HYSTERECTOMY WITH  SALPINGECTOMY Bilateral 02/25/2022   Procedure: TOTAL LAPAROSCOPIC HYSTERECTOMY WITH SALPINGECTOMY;  Surgeon: Megan Salon, MD;  Location: [facility];  Service: Gynecology;  Laterality: Bilateral;   TUBAL LIGATION  2008   Family History  Problem Relation Age of Onset   Heart attack Mother    Skin cancer Father        BCC   Fibroids Sister    Heart attack Maternal Uncle        <50   Congestive Heart Failure Maternal Grandmother    Heart attack Maternal Grandfather    Heart attack Paternal Grandfather    Social History   Tobacco Use   Smoking status: Never   Smokeless tobacco: Never  Vaping Use   Vaping Use: Never used  Substance Use Topics   Alcohol use: Never   Drug use: Never   Current Outpatient Medications  Medication Sig Dispense Refill   fluticasone (FLONASE) 50 MCG/ACT nasal spray Place into both nostrils daily.     ibuprofen (ADVIL) 800 MG tablet Take 1 tablet by mouth every 8 (eight) hours as needed for mild pain, moderate pain or cramping. 30 tablet 0   tamoxifen (NOLVADEX) 20 MG tablet Take 1 tablet (20 mg total) by mouth daily. (Patient taking differently: Take 20 mg by mouth daily. Started 03/11/22) 30 tablet 5   No current facility-administered medications for this visit.   Allergies  Allergen Reactions   Other Dermatitis    Dermabond caused  skin itching, reaction     Review of Systems: All systems reviewed and negative except where noted in HPI.     Physical Exam:    Wt Readings from Last 3 Encounters:  05/11/22 115 lb (52.2 kg)  04/27/22 117 lb (53.1 kg)  04/02/22 119 lb 3.2 oz (54.1 kg)    LMP 07/16/2021 (Exact Date)  Constitutional:  Pleasant, in no acute distress. Psychiatric: Normal mood and affect. Behavior is normal. Neurological: Alert and oriented to person place and time. Skin: Skin is warm and dry. No rashes noted.   ASSESSMENT AND PLAN;   1) Colon cancer screening Due for age-appropriate CRC screening -  Schedule colonoscopy in January per patient preference (still recovering from recent GYN surgery)  2) Iron deficiency anemia 2/2 Fibroids. Recent labs with normalization of hemoglobin and improving iron indices.  Oral iron was stopped in August.  She is otherwise without overt GI bleed - Given history of menorrhagia and fibroids requiring surgery, no need to pursue upper endoscopy or VCE -Repeat CBC and iron panel in 2-3 months to assure normalization off oral iron and after recent surgery  3) History of breast cancer - Continue follow-up in Oncology   The indications, risks, and benefits of colonoscopy were explained to the patient in detail. Risks include but are not limited to bleeding, perforation, adverse reaction to medications, and cardiopulmonary compromise. Sequelae include but are not limited to the possibility of surgery, hospitalization, and mortality. The patient verbalized understanding and wished to proceed in January. All questions answered.     Lavena Bullion, DO, FACG  06/04/2022, 10:11 AM   [facility]*

## 2022-06-12 ENCOUNTER — Ambulatory Visit (INDEPENDENT_AMBULATORY_CARE_PROVIDER_SITE_OTHER): Payer: Commercial Managed Care - HMO | Admitting: Obstetrics & Gynecology

## 2022-06-12 ENCOUNTER — Encounter (HOSPITAL_BASED_OUTPATIENT_CLINIC_OR_DEPARTMENT_OTHER): Payer: Self-pay | Admitting: Obstetrics & Gynecology

## 2022-06-12 VITALS — BP 119/89 | HR 74 | Ht 61.0 in | Wt 115.0 lb

## 2022-06-12 DIAGNOSIS — Z9889 Other specified postprocedural states: Secondary | ICD-10-CM

## 2022-06-12 DIAGNOSIS — T8131XS Disruption of external operation (surgical) wound, not elsewhere classified, sequela: Secondary | ICD-10-CM

## 2022-06-12 NOTE — Progress Notes (Signed)
GYNECOLOGY  VISIT  CC:   post op recheck  HPI: 46 y.o. G2P2002 [name] here for recheck after undergoing TLH/bilateral salpingectomy/cystoscopy on 02/25/2022.  Pt did well post op but had intercourse early and had partial tear of vaginal cuff which was diagnosed on 04/27/2022.  She has been doing pelvic rest since then.  Feels well.  Denies vaginal bleeding or discharge.     MEDS:   Current Outpatient Medications on File Prior to Visit  Medication Sig Dispense Refill   fluticasone (FLONASE) 50 MCG/ACT nasal spray Place into both nostrils daily.     ibuprofen (ADVIL) 800 MG tablet Take 1 tablet by mouth every 8 (eight) hours as needed for mild pain, moderate pain or cramping. (Patient taking differently: Take 800 mg by mouth every 8 (eight) hours as needed for mild pain, moderate pain or cramping. PRN) 30 tablet 0   tamoxifen (NOLVADEX) 20 MG tablet Take 1 tablet (20 mg total) by mouth daily. (Patient taking differently: Take 20 mg by mouth daily. Started 03/11/22) 30 tablet 5   No current facility-administered medications on file prior to visit.    SH:  Smoking No    PHYSICAL EXAMINATION:    BP 119/89 (BP Location: Left Arm, Patient Position: Sitting, Cuff Size: Normal)   Pulse 74   Ht '5\' 1"'$  (1.549 m) Comment: Reporting  Wt 115 lb (52.2 kg)   LMP 07/16/2021 (Exact Date)   BMI 21.73 kg/m     General appearance: alert, cooperative and appears stated age  Pelvic: External genitalia:  no lesions              Urethra:  normal appearing urethra with no masses, tenderness or lesions              Bartholins and Skenes: normal                 Vagina: cuff is approximated but granulation tissue is noted              Cervix: absent              Bimanual Exam:  Uterus:  uterus absent, cuff intact              Assessment/Plan: 1. Post-operative state  2. Dehiscence of vaginal cuff, sequela (partial tear) - pelvic rest still recommended.  Recheck 1 month.

## 2022-06-15 ENCOUNTER — Encounter: Payer: Self-pay | Admitting: Hematology and Oncology

## 2022-06-16 ENCOUNTER — Encounter: Payer: Self-pay | Admitting: Hematology and Oncology

## 2022-06-23 ENCOUNTER — Encounter (HOSPITAL_BASED_OUTPATIENT_CLINIC_OR_DEPARTMENT_OTHER): Payer: Self-pay | Admitting: Obstetrics & Gynecology

## 2022-06-25 ENCOUNTER — Other Ambulatory Visit (HOSPITAL_BASED_OUTPATIENT_CLINIC_OR_DEPARTMENT_OTHER): Payer: Self-pay | Admitting: Obstetrics & Gynecology

## 2022-06-25 DIAGNOSIS — M549 Dorsalgia, unspecified: Secondary | ICD-10-CM

## 2022-06-25 MED ORDER — CYCLOBENZAPRINE HCL 10 MG PO TABS: 10.0000 mg | ORAL_TABLET | Freq: Three times a day (TID) | ORAL | 0 refills | Status: DC | PRN

## 2022-06-26 NOTE — Progress Notes (Unsigned)
    Frances Craig Phone: [phone]   Assessment and Plan:     There are no diagnoses linked to this encounter.  ***   Pertinent previous records reviewed include ***   Follow Up: ***     Subjective:   I, Frances Craig, am serving as a Education administrator for Doctor Glennon Mac  Chief Complaint: back pain   HPI:   06/29/2022 Patient is a 46 year old female complaining of back pain. Patient states  Relevant Historical Information: ***  Additional pertinent review of systems negative.   Current Outpatient Medications:    cyclobenzaprine (FLEXERIL) 10 MG tablet, Take 1 tablet (10 mg total) by mouth every 8 (eight) hours as needed for muscle spasms., Disp: 20 tablet, Rfl: 0   fluticasone (FLONASE) 50 MCG/ACT nasal spray, Place into both nostrils daily., Disp: , Rfl:    ibuprofen (ADVIL) 800 MG tablet, Take 1 tablet by mouth every 8 (eight) hours as needed for mild pain, moderate pain or cramping. (Patient taking differently: Take 800 mg by mouth every 8 (eight) hours as needed for mild pain, moderate pain or cramping. PRN), Disp: 30 tablet, Rfl: 0   tamoxifen (NOLVADEX) 20 MG tablet, Take 1 tablet (20 mg total) by mouth daily. (Patient taking differently: Take 20 mg by mouth daily. Started 03/11/22), Disp: 30 tablet, Rfl: 5   Objective:     There were no vitals filed for this visit.    There is no height or weight on file to calculate BMI.    Physical Exam:    ***   Electronically signed by:  Frances Mccreedy D.[facility] 11:39 AM 06/26/22

## 2022-06-29 ENCOUNTER — Ambulatory Visit (INDEPENDENT_AMBULATORY_CARE_PROVIDER_SITE_OTHER): Payer: Commercial Managed Care - HMO

## 2022-06-29 ENCOUNTER — Ambulatory Visit (INDEPENDENT_AMBULATORY_CARE_PROVIDER_SITE_OTHER): Payer: Commercial Managed Care - HMO | Admitting: Sports Medicine

## 2022-06-29 VITALS — BP 120/78 | HR 78 | Ht 61.0 in | Wt 117.0 lb

## 2022-06-29 DIAGNOSIS — M9902 Segmental and somatic dysfunction of thoracic region: Secondary | ICD-10-CM

## 2022-06-29 DIAGNOSIS — M9903 Segmental and somatic dysfunction of lumbar region: Secondary | ICD-10-CM | POA: Diagnosis not present

## 2022-06-29 DIAGNOSIS — M546 Pain in thoracic spine: Secondary | ICD-10-CM

## 2022-06-29 DIAGNOSIS — M545 Low back pain, unspecified: Secondary | ICD-10-CM

## 2022-06-29 DIAGNOSIS — G8929 Other chronic pain: Secondary | ICD-10-CM

## 2022-06-29 DIAGNOSIS — M9905 Segmental and somatic dysfunction of pelvic region: Secondary | ICD-10-CM

## 2022-06-29 MED ORDER — MELOXICAM 15 MG PO TABS: 15.0000 mg | ORAL_TABLET | Freq: Every day | ORAL | 0 refills | Status: DC

## 2022-06-29 NOTE — Patient Instructions (Addendum)
Good to see you  - Start meloxicam 15 mg daily x2 weeks.  If still having pain after 2 weeks, complete 3rd-week of meloxicam. May use remaining meloxicam as needed once daily for pain control.  Do not to use additional NSAIDs while taking meloxicam.  May use Tylenol [phone] mg 2 to 3 times a day for breakthrough pain. Back HEP Pt referral  3-4 week follow up

## 2022-07-13 ENCOUNTER — Ambulatory Visit
Admission: RE | Admit: 2022-07-13 | Discharge: 2022-07-13 | Disposition: A | Discharge status: Home / Self Care | Payer: Commercial Managed Care - HMO | Source: Ambulatory Visit | Attending: Radiation Oncology | Admitting: Radiation Oncology

## 2022-07-13 ENCOUNTER — Encounter: Payer: Self-pay | Admitting: Radiation Oncology

## 2022-07-13 VITALS — BP 125/88 | HR 92 | Temp 97.0°F | Resp 14 | Ht 61.0 in | Wt 115.2 lb

## 2022-07-13 DIAGNOSIS — D0511 Intraductal carcinoma in situ of right breast: Secondary | ICD-10-CM | POA: Insufficient documentation

## 2022-07-13 DIAGNOSIS — Z79810 Long term (current) use of selective estrogen receptor modulators (SERMs): Secondary | ICD-10-CM | POA: Insufficient documentation

## 2022-07-13 DIAGNOSIS — Z923 Personal history of irradiation: Secondary | ICD-10-CM | POA: Insufficient documentation

## 2022-07-13 DIAGNOSIS — Z170 Estrogen receptor positive status [ER+]: Secondary | ICD-10-CM | POA: Insufficient documentation

## 2022-07-13 NOTE — Progress Notes (Signed)
Radiation Oncology Follow up Note  Name: Frances Craig   Date:   07/13/2022 MRN:  [id number] DOB: 19-Jul-1976    This 46 y.o. 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  Seen today in routine follow-up she is doing well.  She still has some scar tissue in her axilla and notices some pain on stretching.  I have assured her that is normal.  She otherwise specifically denies breast tenderness cough or bone pain.  She is currently on.  Tamoxifen tolerating it well without side effect.  She has not yet had a follow-up mammogram.  COMPLICATIONS OF TREATMENT: none  FOLLOW UP COMPLIANCE: keeps appointments   PHYSICAL EXAM:  BP 125/88   Pulse 92   Temp (!) 97 F (36.1 C)   Resp 14   Ht '5\' 1"'$  (1.549 m)   Wt 115 lb 3.2 oz (52.3 kg)   LMP 07/16/2021 (Exact Date)   BMI 21.77 kg/m  Lungs are clear to A&P cardiac examination essentially unremarkable with regular rate and rhythm. No dominant mass or nodularity is noted in either breast in 2 positions examined. Incision is well-healed. No axillary or supraclavicular adenopathy is appreciated. Cosmetic result is excellent.  Well-developed well-nourished patient in NAD. HEENT reveals PERLA, EOMI, discs not visualized.  Oral cavity is clear. No oral mucosal lesions are identified. Neck is clear without evidence of cervical or supraclavicular adenopathy. Lungs are clear to A&P. Cardiac examination is essentially unremarkable with regular rate and rhythm without murmur rub or thrill. Abdomen is benign with no organomegaly or masses noted. Motor sensory and DTR levels are equal and symmetric in the upper and lower extremities.  Cranial nerves II through XII are grossly intact. Proprioception is intact. No peripheral adenopathy or edema is identified. No motor or sensory levels are noted. Crude visual fields are within normal range.  RADIOLOGY RESULTS: No current films for review  PLAN: At the present time patient is doing well no evidence of disease now out 6 months.  On pleased with her overall progress.  I am asked to see her back in 6 months and will start once year follow-up visits.  Patient knows to call with any concerns.  She continues on tamoxifen without side effect.  I would like to take this opportunity to thank you for allowing me to participate in the care of your patient..Noreene Filbert MD

## 2022-07-16 ENCOUNTER — Ambulatory Visit (INDEPENDENT_AMBULATORY_CARE_PROVIDER_SITE_OTHER): Payer: Commercial Managed Care - HMO | Admitting: Obstetrics & Gynecology

## 2022-07-16 ENCOUNTER — Encounter (HOSPITAL_BASED_OUTPATIENT_CLINIC_OR_DEPARTMENT_OTHER): Payer: Self-pay | Admitting: Obstetrics & Gynecology

## 2022-07-16 VITALS — BP 120/78 | HR 75 | Ht 61.0 in | Wt 114.4 lb

## 2022-07-16 DIAGNOSIS — T8131XD Disruption of external operation (surgical) wound, not elsewhere classified, subsequent encounter: Secondary | ICD-10-CM

## 2022-07-16 DIAGNOSIS — T8131XA Disruption of external operation (surgical) wound, not elsewhere classified, initial encounter: Secondary | ICD-10-CM

## 2022-07-16 DIAGNOSIS — Z9889 Other specified postprocedural states: Secondary | ICD-10-CM

## 2022-07-18 IMAGING — MG MM PLC BREAST LOC DEV 1ST LESION INC*R*
8 series · 8 of 8 positions shown · non-contrast
Comparison: Previous exam(s).

CLINICAL DATA: 45-year-old with a biopsy-proven complex sclerosing
lesion associated with lobular neoplasia (atypical lobular
hyperplasia) involving the UPPER OUTER QUADRANT of the RIGHT breast.
Radioactive seed localization is performed in anticipation of
excisional biopsy which is scheduled for tomorrow.

EXAM:
MAMMOGRAPHIC GUIDED RADIOACTIVE SEED LOCALIZATION OF THE RIGHT
BREAST

[R CC (1 of 4)]
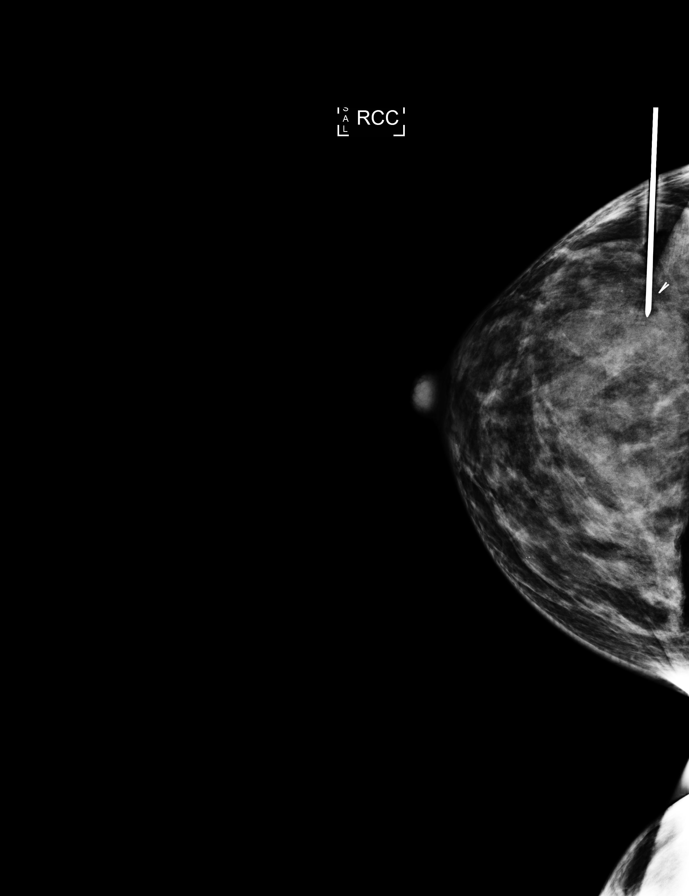

[R LM (1 of 4)]
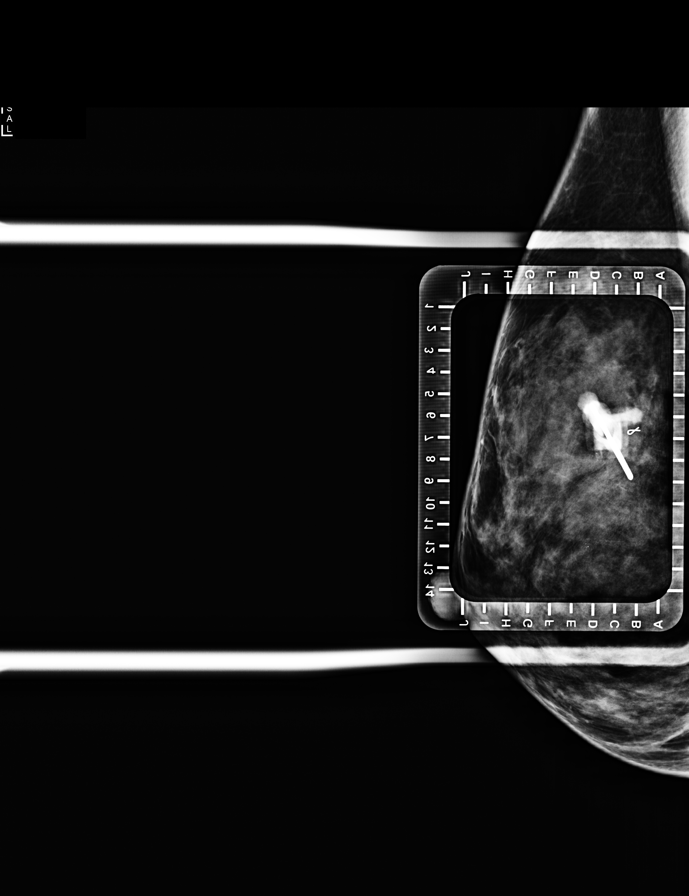

[R LM (2 of 4)]
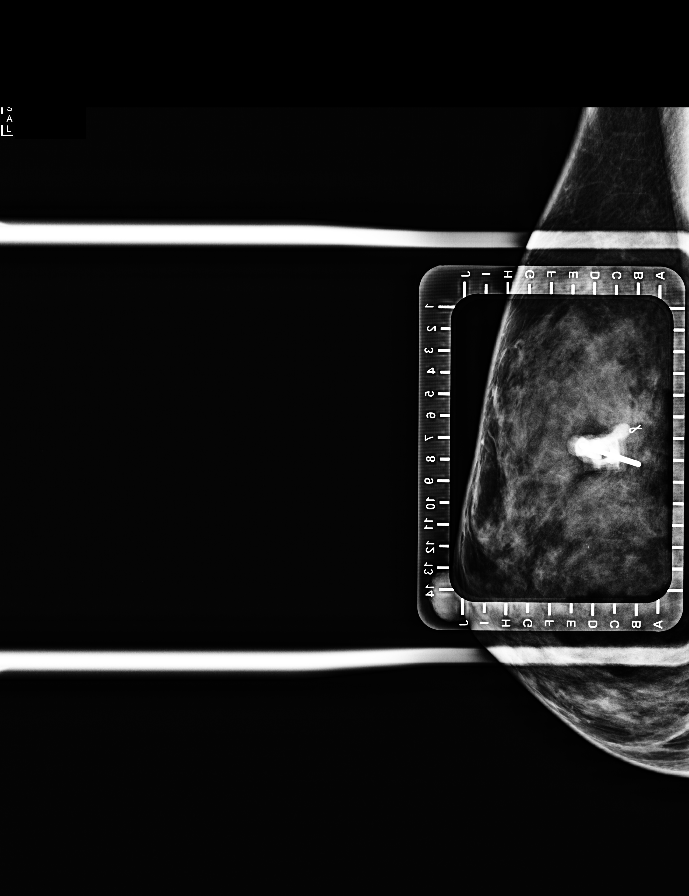

[R CC (2 of 4)]
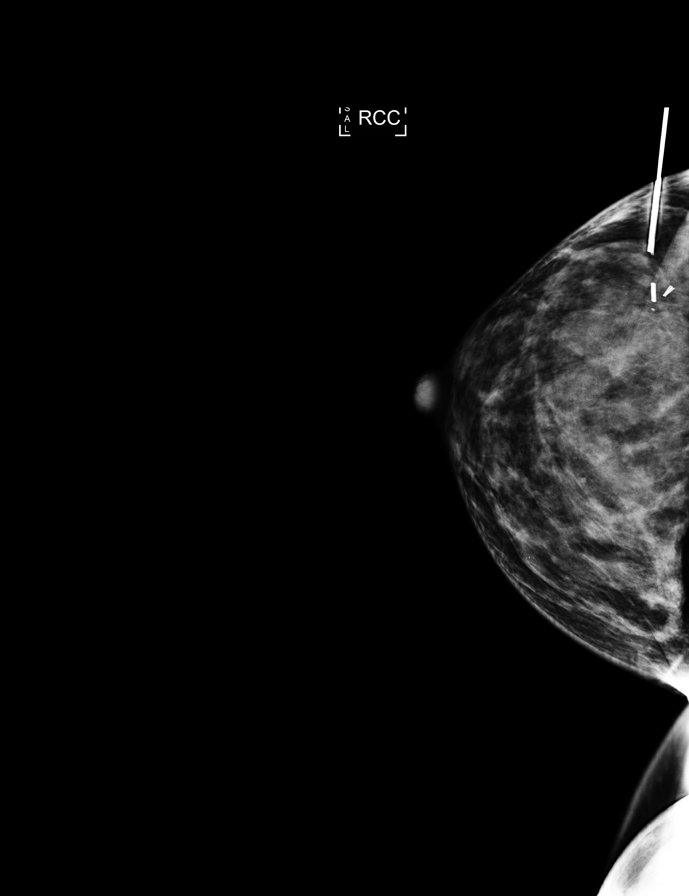

[R CC (3 of 4)]
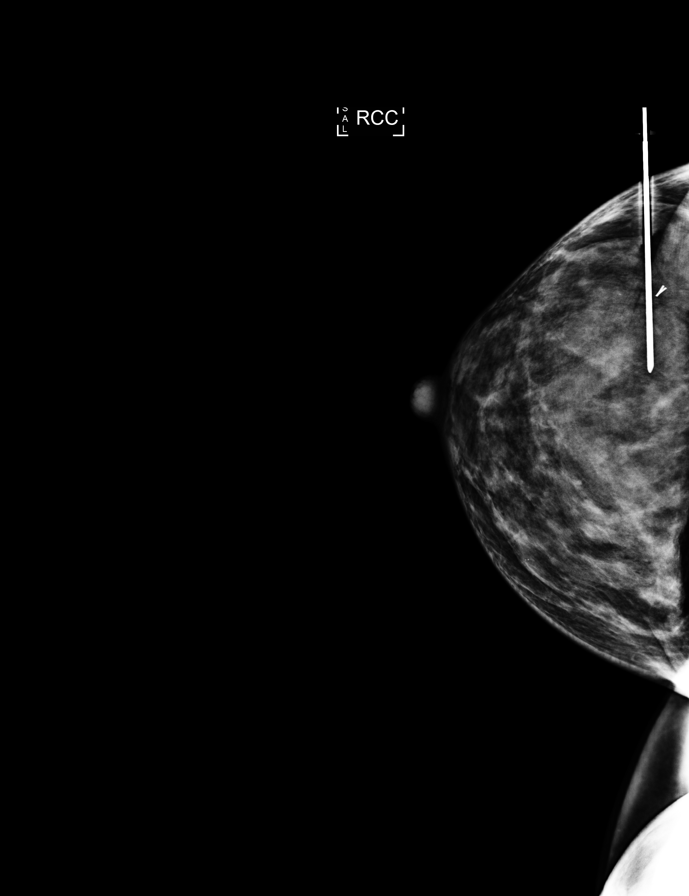

[R LM (3 of 4)]
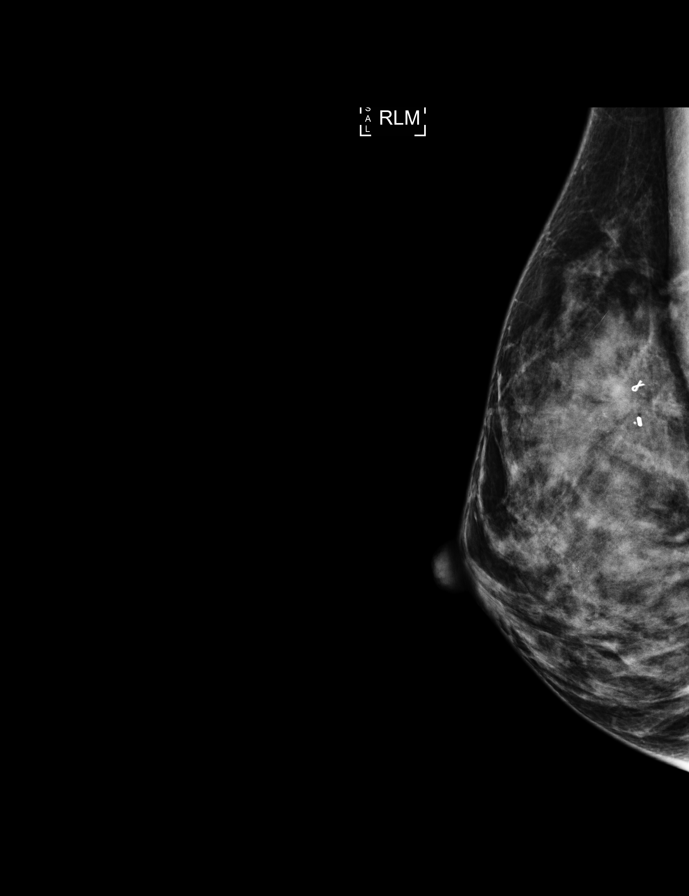

[R CC (4 of 4)]
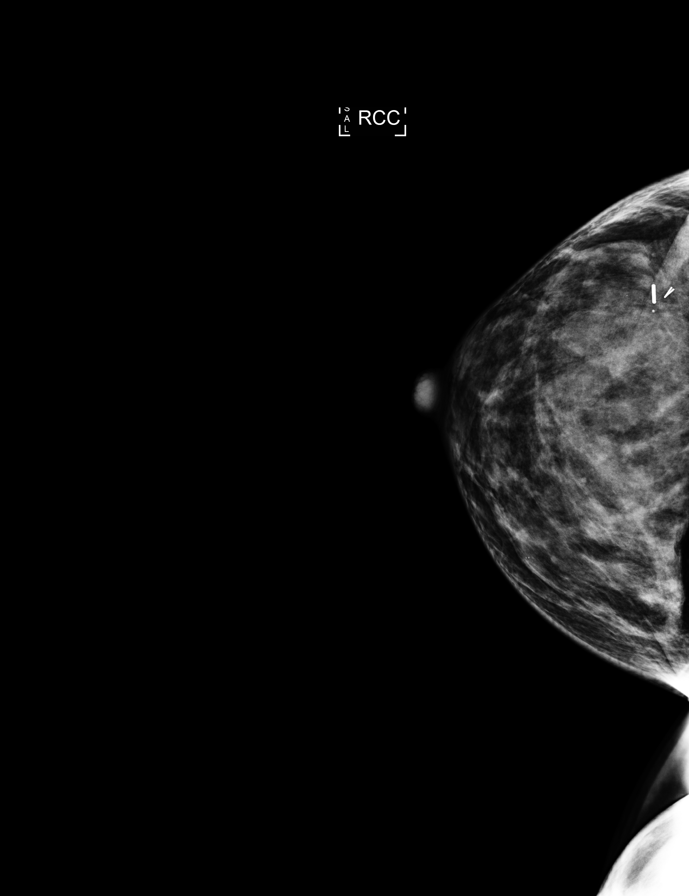

[R LM (4 of 4)]
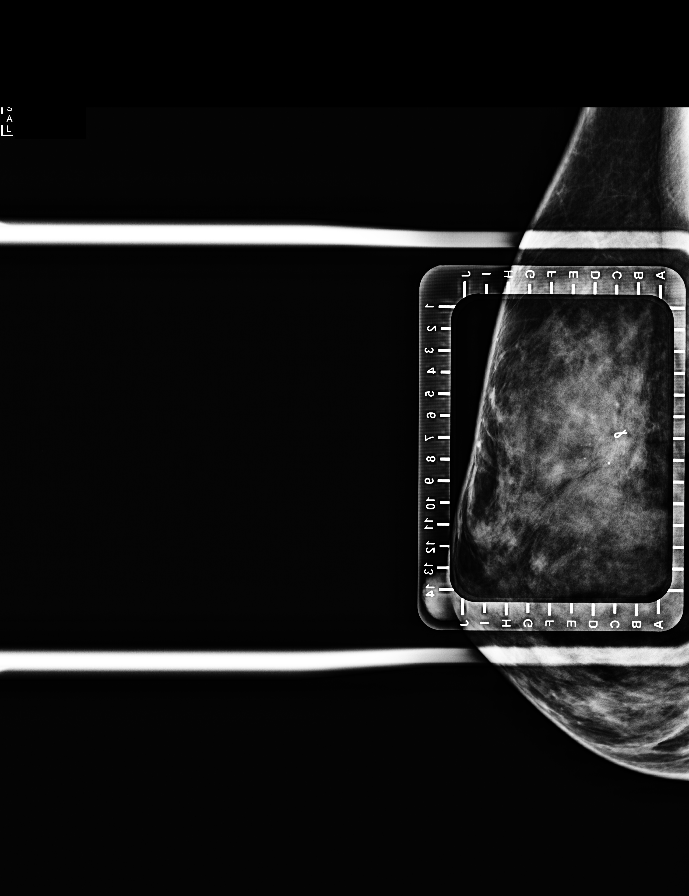

[8 of 8 positions shown; findings below may reference images not displayed]

FINDINGS: Patient presents for radioactive seed localization prior to RIGHT
breast excisional biopsy. I met with the patient and we discussed
the procedure of seed localization including benefits and
alternatives. We discussed the high likelihood of a successful
procedure. We discussed the risks of the procedure including
infection, bleeding, tissue injury and further surgery. We discussed
the low dose of radioactivity involved in the procedure. Informed,
written consent was given.

The usual time-out protocol was performed immediately prior to the
procedure.

Using mammographic guidance, sterile technique with chlorhexidine as
skin antisepsis, 1% lidocaine as local anesthetic, an [id number]
radioactive seed was used to localize the ribbon shaped tissue
marking clip and calcifications associated with the architectural
distortion in the UPPER OUTER QUADRANT using a lateral approach. The
follow-up mammogram images confirm that the seed is in the expected
location, positioned in the center of the distortion approximately 6
mm inferior to the clip. The images are marked for Dr. Rhoel.

Follow-up survey of the patient confirms the presence of the
radioactive seed.

Order number of [id number] seed: [id number]

Total activity: 0.239 mCi

Reference Date: 10/13/2021

The patient tolerated the procedure well and was released from the
[REDACTED]. She was given instructions regarding seed removal.
IMPRESSION: Radioactive seed localization of a biopsy-proven complex sclerosing
lesion associated with ALH involving the UPPER OUTER QUADRANT of the
RIGHT breast. No apparent complications.

## 2022-07-19 IMAGING — MG MM BREAST SURGICAL SPECIMEN
1 series · 1 of 1 positions shown · non-contrast
Comparison: Previous exam(s).

CLINICAL DATA: Biopsy-proven complex sclerosing lesion associated
with atypical lobular hyperplasia involving the UPPER OUTER QUADRANT
of the RIGHT breast. Radioactive seed localization was performed
yesterday in anticipation of today's excisional biopsy.

EXAM:
SPECIMEN RADIOGRAPH OF THE RIGHT BREAST

[R]
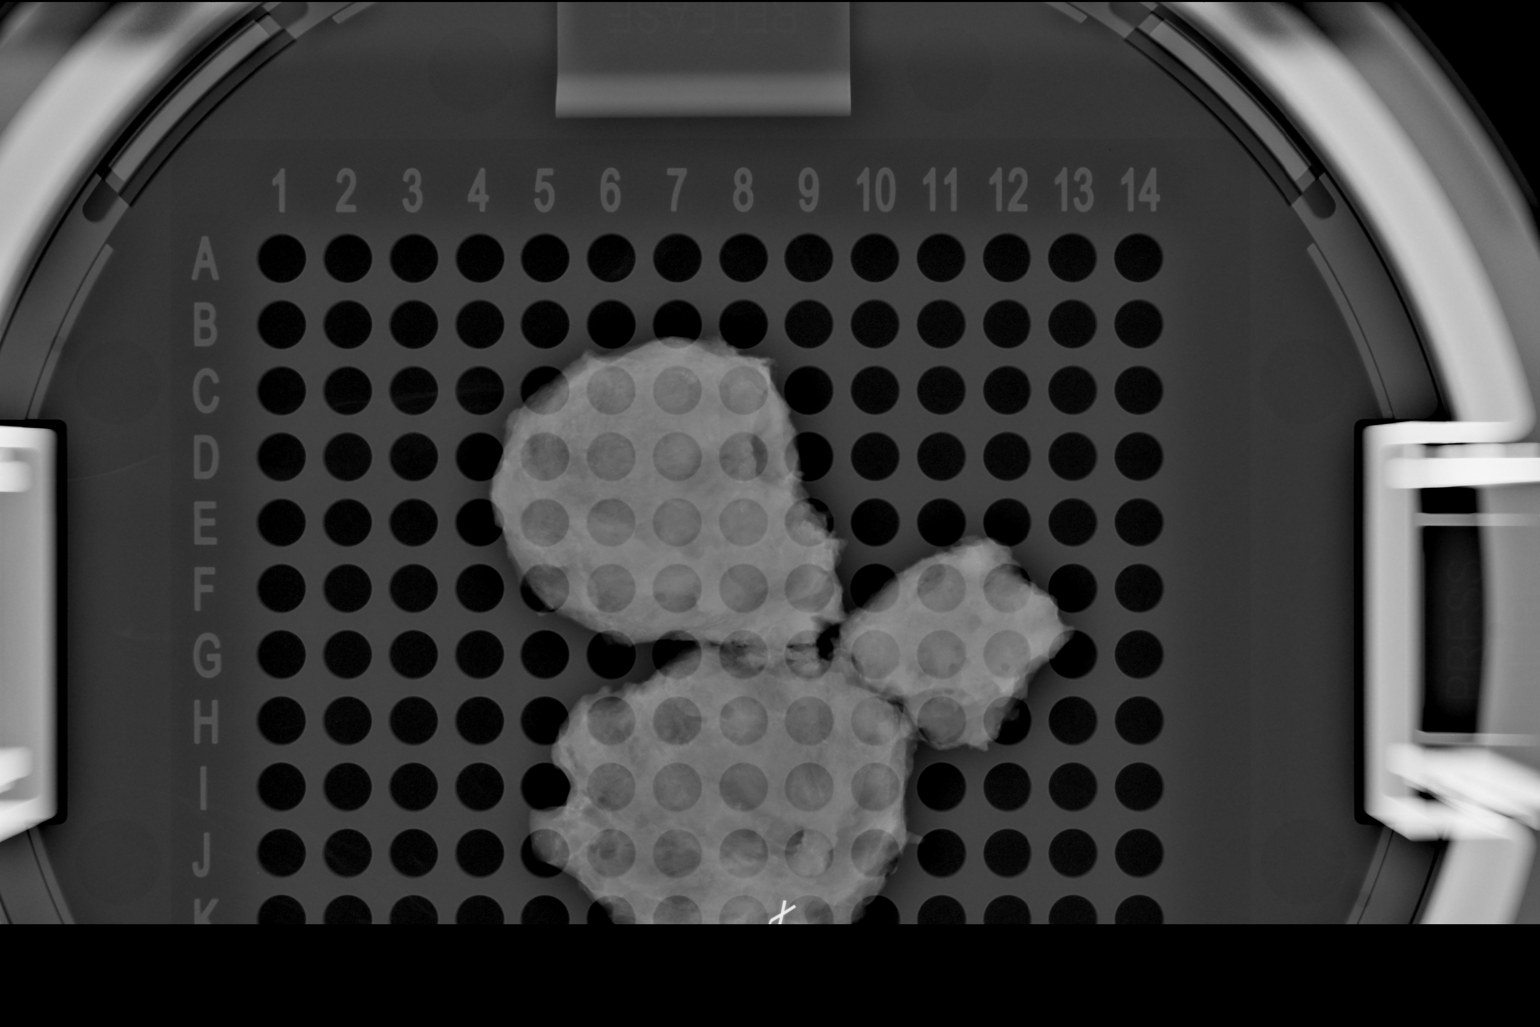

[1 of 1 positions shown; findings below may reference images not displayed]

FINDINGS: Status post excision of the RIGHT breast. The initial specimen
radiograph contains the radioactive seed which is intact. The second
specimen contains the ribbon shaped tissue marking clip. The images
were marked for pathology. The results were discussed telephone with
the operating room nurse at time of interpretation.
IMPRESSION: Specimen radiograph of the RIGHT breast.

## 2022-07-19 NOTE — Progress Notes (Signed)
GYNECOLOGY  VISIT  CC:   post op recheck  HPI: 46 y.o. G40P2002 [name] here for recheck after having partial thickness tear of vaginal apex due to early intercourse.  Pt was not released at that time for that activity.  However, cuff has been slowly healing and she is back for, hopefully, her last post op visit.  Doing well.  No complaints.  No VB/discharge.  Really hopeful to be SA again soon.   MEDS:   Current Outpatient Medications on File Prior to Visit  Medication Sig Dispense Refill   cyclobenzaprine (FLEXERIL) 10 MG tablet Take 1 tablet (10 mg total) by mouth every 8 (eight) hours as needed for muscle spasms. 20 tablet 0   fluticasone (FLONASE) 50 MCG/ACT nasal spray Place into both nostrils daily.     ibuprofen (ADVIL) 800 MG tablet Take 1 tablet by mouth every 8 (eight) hours as needed for mild pain, moderate pain or cramping. (Patient taking differently: Take 800 mg by mouth every 8 (eight) hours as needed for mild pain, moderate pain or cramping. PRN) 30 tablet 0   tamoxifen (NOLVADEX) 20 MG tablet Take 1 tablet (20 mg total) by mouth daily. (Patient taking differently: Take 20 mg by mouth daily. Started 03/11/22) 30 tablet 5   meloxicam (MOBIC) 15 MG tablet Take 1 tablet (15 mg total) by mouth daily. (Patient not taking: Reported on 07/16/2022) 30 tablet 0   No current facility-administered medications on file prior to visit.    SH:  Smoking No    PHYSICAL EXAMINATION:    BP 120/78 (BP Location: Left Arm, Patient Position: Sitting, Cuff Size: Normal)   Pulse 75   Ht '5\' 1"'$  (1.549 m) Comment: Reported  Wt 114 lb 6.4 oz (51.9 kg)   LMP 07/16/2021 (Exact Date)   BMI 21.62 kg/m     General appearance: alert, cooperative and appears stated age  Pelvic: External genitalia:  no lesions              Urethra:  normal appearing urethra with no masses, tenderness or lesions              Bartholins and Skenes: normal                 Vagina: normal  appearing vagina with normal color and discharge, no lesions              Cervix: absent and cuff well healed              Bimanual Exam:  Uterus:  uterus absent              Adnexa: no mass, fullness, tenderness  Assessment/Plan: 1. Post-operative state - pt is almost at 12 weeks since partial thickness cuff tear.  Feel she is safe to return to normal activity and can now stop pelvic rest. - she will return for 1 year follow up or sooner with any new issues  2. Vaginal cuff dehiscence, subsequent encounter - appears to be healed at this point

## 2022-07-24 NOTE — Progress Notes (Unsigned)
   [name] D.[place] Phone: [phone]   Assessment and Plan:     There are no diagnoses linked to this encounter.  *** - Patient has received significant relief with OMT in the past.  Elects for repeat OMT today.  Tolerated well per note below. - Decision today to treat with OMT was based on Physical Exam   After verbal consent patient was treated with HVLA (high velocity low amplitude), ME (muscle energy), FPR (flex positional release), ST (soft tissue), PC/PD (Pelvic Compression/ Pelvic Decompression) techniques in cervical, rib, thoracic, lumbar, and pelvic areas. Patient tolerated the procedure well with improvement in symptoms.  Patient educated on potential side effects of soreness and recommended to rest, hydrate, and use Tylenol as needed for pain control.   Pertinent previous records reviewed include ***   Follow Up: ***     Subjective:   I, Andraya Frigon, am serving as a Education administrator for Doctor Glennon Mac   Chief Complaint: back pain    HPI:    06/29/2022 Patient is a 46 year old female complaining of back pain. Patient states that she had a hysterectomy July feels like its burning bilateral but more on the right thoracic to lumbar , has to stretch to be able to move , sleeping is hard, constant uncomfortableness, feels achy sometimes,, ib helps and does tae flexeril seems to help sleep bu the pain does not go away , goes get some numbness from her armpit to her elbow    07/27/2022 Patient states   Relevant Historical Information: History of breast cancer  Additional pertinent review of systems negative.  Current Outpatient Medications  Medication Sig Dispense Refill   cyclobenzaprine (FLEXERIL) 10 MG tablet Take 1 tablet (10 mg total) by mouth every 8 (eight) hours as needed for muscle spasms. 20 tablet 0   fluticasone (FLONASE) 50 MCG/ACT nasal spray Place into both nostrils daily.      ibuprofen (ADVIL) 800 MG tablet Take 1 tablet by mouth every 8 (eight) hours as needed for mild pain, moderate pain or cramping. (Patient taking differently: Take 800 mg by mouth every 8 (eight) hours as needed for mild pain, moderate pain or cramping. PRN) 30 tablet 0   meloxicam (MOBIC) 15 MG tablet Take 1 tablet (15 mg total) by mouth daily. (Patient not taking: Reported on 07/16/2022) 30 tablet 0   tamoxifen (NOLVADEX) 20 MG tablet Take 1 tablet (20 mg total) by mouth daily. (Patient taking differently: Take 20 mg by mouth daily. Started 03/11/22) 30 tablet 5   No current facility-administered medications for this visit.      Objective:     There were no vitals filed for this visit.    There is no height or weight on file to calculate BMI.    Physical Exam:     General: Well-appearing, cooperative, sitting comfortably in no acute distress.   OMT Physical Exam:  ASIS Compression Test: Positive Right Cervical: TTP paraspinal, *** Rib: Bilateral elevated first rib with TTP Thoracic: TTP paraspinal,*** Lumbar: TTP paraspinal,*** Pelvis: Right anterior innominate  Electronically signed by:  [name] D.[facility] 7:40 AM 07/24/22

## 2022-07-27 ENCOUNTER — Ambulatory Visit (INDEPENDENT_AMBULATORY_CARE_PROVIDER_SITE_OTHER): Payer: Commercial Managed Care - HMO | Admitting: Sports Medicine

## 2022-07-27 VITALS — BP 110/78 | HR 86 | Ht 61.0 in | Wt 123.0 lb

## 2022-07-27 DIAGNOSIS — M546 Pain in thoracic spine: Secondary | ICD-10-CM

## 2022-07-27 DIAGNOSIS — M545 Low back pain, unspecified: Secondary | ICD-10-CM

## 2022-07-27 DIAGNOSIS — G8929 Other chronic pain: Secondary | ICD-10-CM

## 2022-07-27 DIAGNOSIS — M9903 Segmental and somatic dysfunction of lumbar region: Secondary | ICD-10-CM | POA: Diagnosis not present

## 2022-07-27 DIAGNOSIS — M9902 Segmental and somatic dysfunction of thoracic region: Secondary | ICD-10-CM

## 2022-07-27 DIAGNOSIS — M9905 Segmental and somatic dysfunction of pelvic region: Secondary | ICD-10-CM | POA: Diagnosis not present

## 2022-07-27 NOTE — Patient Instructions (Addendum)
Good to see you  Start PT  Recommend discontinue daily meloxicam use remainder as needed  4 week follow up

## 2022-08-06 ENCOUNTER — Inpatient Hospital Stay: Payer: Commercial Managed Care - HMO | Attending: Hematology and Oncology

## 2022-08-06 ENCOUNTER — Encounter: Payer: Self-pay | Admitting: Hematology and Oncology

## 2022-08-06 ENCOUNTER — Inpatient Hospital Stay (HOSPITAL_BASED_OUTPATIENT_CLINIC_OR_DEPARTMENT_OTHER): Payer: Commercial Managed Care - HMO | Admitting: Hematology and Oncology

## 2022-08-06 DIAGNOSIS — D649 Anemia, unspecified: Secondary | ICD-10-CM

## 2022-08-06 DIAGNOSIS — N63 Unspecified lump in unspecified breast: Secondary | ICD-10-CM

## 2022-08-06 DIAGNOSIS — D0511 Intraductal carcinoma in situ of right breast: Secondary | ICD-10-CM | POA: Insufficient documentation

## 2022-08-06 DIAGNOSIS — Z170 Estrogen receptor positive status [ER+]: Secondary | ICD-10-CM | POA: Diagnosis not present

## 2022-08-06 DIAGNOSIS — Z79810 Long term (current) use of selective estrogen receptor modulators (SERMs): Secondary | ICD-10-CM | POA: Diagnosis not present

## 2022-08-06 LAB — CMP (CANCER CENTER ONLY)
ALT: 11 U/L (ref 0–44)
AST: 11 U/L — ABNORMAL LOW (ref 15–41)
Albumin: 4.2 g/dL (ref 3.5–5.0)
Alkaline Phosphatase: 47 U/L (ref 38–126)
Anion gap: 5 (ref 5–15)
BUN: 13 mg/dL (ref 6–20)
CO2: 28 mmol/L (ref 22–32)
Calcium: 9.7 mg/dL (ref 8.9–10.3)
Chloride: 108 mmol/L (ref 98–111)
Creatinine: 0.85 mg/dL (ref 0.44–1.00)
GFR, Estimated: >60 mL/min (ref >60)
Glucose, Bld: 100 mg/dL — ABNORMAL HIGH (ref 70–99)
Potassium: 5.1 mmol/L (ref 3.5–5.1)
Sodium: 141 mmol/L (ref 135–145)
Total Bilirubin: 0.5 mg/dL (ref 0.3–1.2)
Total Protein: 6.6 g/dL (ref 6.5–8.1)

## 2022-08-06 LAB — CBC WITH DIFFERENTIAL (CANCER CENTER ONLY)
Abs Immature Granulocytes: 0.03 10*3/uL (ref 0.00–0.07)
Basophils Absolute: 0.1 10*3/uL (ref 0.0–0.1)
Basophils Relative: 1 %
Eosinophils Absolute: 0.2 10*3/uL (ref 0.0–0.5)
Eosinophils Relative: 3 %
HCT: 40.2 % (ref 36.0–46.0)
Hemoglobin: 12.9 g/dL (ref 12.0–15.0)
Immature Granulocytes: 1 %
Lymphocytes Relative: 17 %
Lymphs Abs: 1 10*3/uL (ref 0.7–4.0)
MCH: 28.8 pg (ref 26.0–34.0)
MCHC: 32.1 g/dL (ref 30.0–36.0)
MCV: 89.7 fL (ref 80.0–100.0)
Monocytes Absolute: 0.6 10*3/uL (ref 0.1–1.0)
Monocytes Relative: 9 %
Neutro Abs: 4.3 10*3/uL (ref 1.7–7.7)
Neutrophils Relative %: 69 %
Platelet Count: 324 10*3/uL (ref 150–400)
RBC: 4.48 MIL/uL (ref 3.87–5.11)
RDW: 13.9 % (ref 11.5–15.5)
WBC Count: 6.1 10*3/uL (ref 4.0–10.5)
nRBC: 0 % (ref 0.0–0.2)

## 2022-08-06 NOTE — Assessment & Plan Note (Addendum)
She is now status post lumpectomy and adjuvant radiation therapy.  She tolerated radiation very well.  She is now on adjuvant tamoxifen.   She is tolerating this really well.  No cough, chest pain, shortness of breath, lower extremity swelling. She is doing much better from the hysterectomy and iron deficiency anemia standpoint. Physical examination today unremarkable, no palpable masses breast exam.  No regional adenopathy.  Mammogram scheduled for Sep 16 2022. RTC in 6 months.

## 2022-08-06 NOTE — Progress Notes (Signed)
[place] NOTE  Patient Care Team: Pcp, No as PCP - General Megan Salon, MD as Obstetrician (Obstetrics and Gynecology) Coralie Keens, MD as Consulting Physician (General Surgery) Benay Pike, MD as Consulting Physician (Hematology and Oncology) Noreene Filbert, MD as Consulting Physician (Radiation Oncology)  CHIEF COMPLAINTS/PURPOSE OF CONSULTATION:  History of breast cancer  HISTORY OF PRESENTING ILLNESS:   Frances Craig 46 y.o. female is here because of recent diagnosis of right breast DCIS and LCIS.  Patient had a diagnostic mammogram bilaterally which showed multiple abnormal findings in the right breast which showed 2.4 x 1 x 3.7 cm irregular spiculated mass at the 10:30 position 3 cm from the nipple, no abnormal right axillary lymph nodes noted.  A 4 x 3 x 4 mm benign complicated cyst at the 5 o'clock position of the retroareolar breast, 1.2 x 1.7 x 1.0 cm benign simple cyst at the 10 o'clock position 1 cm from the nipple.  No sonographic abnormalities in the upper outer left breast.  Ultrasound-guided right breast biopsy was scheduled Ultrasound of the breast and biopsy showed complex sclerosing lesion with lobular neoplasia and calcifications, atypical lobular hyperplasia. Right breast lumpectomy showed lobular carcinoma in situ involving a complex sclerosing lesion with calcifications, margins negative, fibrocystic changes with usual ductal hyperplasia and calcifications additional margin excision showed DCIS, 1 cm, fibrocystic changes with usual ductal hyperplasia and calcifications, margins negative, prognostic showed ER positive, 90% moderate weak staining intensity, PR positive, 95% moderate staining intensity  She completed adjuvant radiation 01/01/2022.  I reviewed her records extensively and collaborated the history with the patient.  SUMMARY OF ONCOLOGIC HISTORY: Oncology History  Ductal carcinoma in situ (DCIS) of right breast   11/06/2021 Cancer Staging   Staging form: Breast, AJCC 8th Edition - Clinical stage from 11/06/2021: Stage 0 (cTis (DCIS), cN0, cM0, ER+, PR+) - Signed by Gardenia Phlegm, NP on 04/01/2022 Stage prefix: Initial diagnosis   11/06/2021 Surgery   Right breast lumpectomy: Lobular carcinoma in situ involving a complex sclerosing lesion, and the additional superior deep margin excision ductal carcinoma in situ was noted that was 1 cm ER/PR positive.   11/20/2021 Initial Diagnosis   Ductal carcinoma in situ (DCIS) of right breast   12/02/2021 Genetic Testing   Negative genetic testing on the CancerNext-Expanded+RNAinsight panel.  The report date is 12/02/2021.  The CancerNext-Expanded gene panel offered by [facility] and includes sequencing and rearrangement analysis for the following 77 genes: AIP, ALK, APC*, ATM*, AXIN2, BAP1, BARD1, BLM, BMPR1A, BRCA1*, BRCA2*, BRIP1*, CDC73, CDH1*, CDK4, CDKN1B, CDKN2A, CHEK2*, CTNNA1, DICER1, FANCC, FH, FLCN, GALNT12, KIF1B, LZTR1, MAX, MEN1, MET, MLH1*, MSH2*, MSH3, MSH6*, MUTYH*, NBN, NF1*, NF2, NTHL1, PALB2*, PHOX2B, PMS2*, POT1, PRKAR1A, PTCH1, PTEN*, RAD51C*, RAD51D*, RB1, RECQL, RET, SDHA, SDHAF2, SDHB, SDHC, SDHD, SMAD4, SMARCA4, SMARCB1, SMARCE1, STK11, SUFU, TMEM127, TP53*, TSC1, TSC2, VHL and XRCC2 (sequencing and deletion/duplication); EGFR, EGLN1, HOXB13, KIT, MITF, PDGFRA, POLD1, and POLE (sequencing only); EPCAM and GREM1 (deletion/duplication only). DNA and RNA analyses performed for * genes.    12/04/2021 - 01/01/2022 Radiation Therapy   Right breast: 42.56 Gy in 16 fractions, Boost: 10 GY in 5 fractions   02/2022 -  Anti-estrogen oral therapy   Tamoxifen    Interval History  Since last visit, she had hysterectomy, doing very well. She is taking tamoxifen every day.  No side effects. Rest of the pertinent 10 point ROS reviewed and negative  MEDICAL HISTORY:  Past Medical History:  Diagnosis Date  Anemia    Cancer ([place]) 11/06/2021    right breast cancer- lobular carcinoma & ductal carcinoma / see 11/06/21 pathology report from right breast lumpectomy in Epic   COVID-19    late 2021 or early 2022 per pt, only mild symptoms   GERD (gastroesophageal reflux disease)    History of anemia 2022   chronic anemia, 10/28/21 Hgb 7.9 in Epic, 12/29/21 Hgb 8.9 / Patient has been receiving weekly iron infusions, 12/15/21 - 12/29/21. Patient received 5 more iron infusions in 01/2022.   Hx of radiation therapy    12/04/21 - 01/01/22 as of 12/31/21, radiation to right breast   Wears contact lenses    Wears glasses     SURGICAL HISTORY: Past Surgical History:  Procedure Laterality Date   BREAST LUMPECTOMY WITH RADIOACTIVE SEED LOCALIZATION Right 11/06/2021   Procedure: RIGHT BREAST LUMPECTOMY WITH RADIOACTIVE SEED LOCALIZATION;  Surgeon: Coralie Keens, MD;  Location: [facility];  Service: General;  Laterality: Right;  LMA   CYSTOSCOPY N/A 02/25/2022   Procedure: Consuela Mimes;  Surgeon: Megan Salon, MD;  Location: [facility];  Service: Gynecology;  Laterality: N/A;   TOTAL LAPAROSCOPIC HYSTERECTOMY WITH SALPINGECTOMY Bilateral 02/25/2022   Procedure: TOTAL LAPAROSCOPIC HYSTERECTOMY WITH SALPINGECTOMY;  Surgeon: Megan Salon, MD;  Location: [facility];  Service: Gynecology;  Laterality: Bilateral;   TUBAL LIGATION  2008    SOCIAL HISTORY: Social History   Socioeconomic History   Marital status: Married    Spouse name: Not on file   Number of children: 2   Years of education: Not on file   Highest education level: High school graduate  Occupational History   Occupation: homemaker  Tobacco Use   Smoking status: Never   Smokeless tobacco: Never  Vaping Use   Vaping Use: Never used  Substance and Sexual Activity   Alcohol use: Never   Drug use: Never   Sexual activity: Yes    Birth control/protection: Pill, Surgical    Comment: tubal ligation  Other Topics Concern   Not on file  Social History  Narrative   Not on file   Social Determinants of Health   Financial Resource Strain: Not on file  Food Insecurity: No Food Insecurity (08/05/2021)   Hunger Vital Sign    Worried About Running Out of Food in the Last Year: Never true    Ran Out of Food in the Last Year: Never true  Transportation Needs: No Transportation Needs (08/05/2021)   PRAPARE - Hydrologist (Medical): No    Lack of Transportation (Non-Medical): No  Physical Activity: Not on file  Stress: Not on file  Social Connections: Not on file  Intimate Partner Violence: Not At Risk (11/20/2021)   Humiliation, Afraid, Rape, and Kick questionnaire    Fear of Current or Ex-Partner: No    Emotionally Abused: No    Physically Abused: No    Sexually Abused: No    FAMILY HISTORY: Family History  Problem Relation Age of Onset   Heart disease Mother    Diabetes Mother    Heart attack Mother    Skin cancer Father        BCC   Fibroids Sister    Heart attack Maternal Uncle        <50   Diabetes Maternal Grandmother    Congestive Heart Failure Maternal Grandmother    Heart attack Maternal Grandfather    Heart attack Paternal Grandfather     ALLERGIES:  is allergic to other.  MEDICATIONS:  Current Outpatient Medications  Medication Sig Dispense Refill   cyclobenzaprine (FLEXERIL) 10 MG tablet Take 1 tablet (10 mg total) by mouth every 8 (eight) hours as needed for muscle spasms. 20 tablet 0   fluticasone (FLONASE) 50 MCG/ACT nasal spray Place into both nostrils daily.     ibuprofen (ADVIL) 800 MG tablet Take 1 tablet by mouth every 8 (eight) hours as needed for mild pain, moderate pain or cramping. (Patient taking differently: Take 800 mg by mouth every 8 (eight) hours as needed for mild pain, moderate pain or cramping. PRN) 30 tablet 0   meloxicam (MOBIC) 15 MG tablet Take 1 tablet (15 mg total) by mouth daily. 30 tablet 0   tamoxifen (NOLVADEX) 20 MG tablet Take 1 tablet (20 mg total) by  mouth daily. (Patient taking differently: Take 20 mg by mouth daily. Started 03/11/22) 30 tablet 5   No current facility-administered medications for this visit.    Rest of the pertinent 10 point ROS reviewed and negative  PHYSICAL EXAMINATION: ECOG PERFORMANCE STATUS: 0 - Asymptomatic  Vitals:   08/06/22 1127  BP: 122/75  Pulse: 78  Resp: 16  Temp: 97.7 F (36.5 C)  SpO2: 100%   Filed Weights   08/06/22 1127  Weight: 110 lb 14.4 oz (50.3 kg)   Physical Exam Constitutional:      Appearance: Normal appearance.  Cardiovascular:     Rate and Rhythm: Normal rate and regular rhythm.     Pulses: Normal pulses.     Heart sounds: Normal heart sounds.  Pulmonary:     Effort: Pulmonary effort is normal.     Breath sounds: Normal breath sounds.  Abdominal:     General: Abdomen is flat. Bowel sounds are normal.     Palpations: Abdomen is soft.  Musculoskeletal:        General: No swelling or tenderness. Normal range of motion.     Cervical back: Normal range of motion and neck supple. No rigidity.  Lymphadenopathy:     Cervical: No cervical adenopathy.  Skin:    General: Skin is warm and dry.  Neurological:     General: No focal deficit present.     Mental Status: She is alert.      LABORATORY DATA:  I have reviewed the data as listed Lab Results  Component Value Date   WBC 6.1 08/06/2022   HGB 12.9 08/06/2022   HCT 40.2 08/06/2022   MCV 89.7 08/06/2022   PLT 324 08/06/2022   Lab Results  Component Value Date   NA 141 08/06/2022   K 5.1 08/06/2022   CL 108 08/06/2022   CO2 28 08/06/2022   Labs from today reviewed CBC showed hemoglobin of 8.7, microcytic, hypochromic, mild thrombocytosis Iron panel showed a TIBC of 535, iron saturation of 5, ferritin pending from today  RADIOGRAPHIC STUDIES: I have personally reviewed the radiological reports and agreed with the findings in the report.  ASSESSMENT AND PLAN:   Ductal carcinoma in situ (DCIS) of right  breast She is now status post lumpectomy and adjuvant radiation therapy.  She tolerated radiation very well.  She is now on adjuvant tamoxifen.   She is tolerating this really well.  No cough, chest pain, shortness of breath, lower extremity swelling. She is doing much better from the hysterectomy and iron deficiency anemia standpoint. Physical examination today unremarkable, no palpable masses breast exam.  No regional adenopathy.  Mammogram scheduled for Sep 16 2022. RTC  in 6 months.  Total time spent: 30 minutes  All questions were answered. The patient knows to call the clinic with any problems, questions or concerns.    Benay Pike, MD 08/06/22

## 2022-08-06 NOTE — Progress Notes (Signed)
[place] NOTE  Patient Care Team: Pcp, No as PCP - General Megan Salon, MD as Obstetrician (Obstetrics and Gynecology) Coralie Keens, MD as Consulting Physician (General Surgery) Benay Pike, MD as Consulting Physician (Hematology and Oncology) Noreene Filbert, MD as Consulting Physician (Radiation Oncology)  CHIEF COMPLAINTS/PURPOSE OF CONSULTATION:  Anemia follow up, History of breast cancer  HISTORY OF PRESENTING ILLNESS:  Frances Craig 46 y.o. female is here because of recent diagnosis of right breast DCIS and LCIS  Patient had a diagnostic mammogram bilaterally which showed multiple abnormal findings in the right breast which showed 2.4 x 1 x 3.7 cm irregular spiculated mass at the 10:30 position 3 cm from the nipple, no abnormal right axillary lymph nodes noted.  A 4 x 3 x 4 mm benign complicated cyst at the 5 o'clock position of the retroareolar breast, 1.2 x 1.7 x 1.0 cm benign simple cyst at the 10 o'clock position 1 cm from the nipple.  No sonographic abnormalities in the upper outer left breast.  Ultrasound-guided right breast biopsy was scheduled Ultrasound of the breast and biopsy showed complex sclerosing lesion with lobular neoplasia and calcifications, atypical lobular hyperplasia. 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  SUMMARY OF ONCOLOGIC HISTORY: Oncology History  Ductal carcinoma in situ (DCIS) of  right breast  11/06/2021 Cancer Staging   Staging form: Breast, AJCC 8th Edition - Clinical stage from 11/06/2021: Stage 0 (cTis (DCIS), cN0, cM0, ER+, PR+) - Signed by Gardenia Phlegm, NP on 04/01/2022 Stage prefix: Initial diagnosis   11/06/2021 Surgery   Right breast lumpectomy: Lobular carcinoma in situ involving a complex sclerosing lesion, and the additional superior deep margin excision ductal carcinoma in situ was noted that was 1 cm ER/PR positive.   11/20/2021 Initial Diagnosis   Ductal carcinoma in situ (DCIS) of right breast   12/02/2021 Genetic Testing   Negative genetic testing on the CancerNext-Expanded+RNAinsight panel.  The report date is 12/02/2021.  The CancerNext-Expanded gene panel offered by [facility] and includes sequencing and rearrangement analysis for the following 77 genes: AIP, ALK, APC*, ATM*, AXIN2, BAP1, BARD1, BLM, BMPR1A, BRCA1*, BRCA2*, BRIP1*, CDC73, CDH1*, CDK4, CDKN1B, CDKN2A, CHEK2*, CTNNA1, DICER1, FANCC, FH, FLCN, GALNT12, KIF1B, LZTR1, MAX, MEN1, MET, MLH1*, MSH2*, MSH3, MSH6*, MUTYH*, NBN, NF1*, NF2, NTHL1, PALB2*, PHOX2B, PMS2*, POT1, PRKAR1A, PTCH1, PTEN*, RAD51C*, RAD51D*, RB1, RECQL, RET, SDHA, SDHAF2, SDHB, SDHC, SDHD, SMAD4, SMARCA4, SMARCB1, SMARCE1, STK11, SUFU, TMEM127, TP53*, TSC1, TSC2, VHL and XRCC2 (sequencing and deletion/duplication); EGFR, EGLN1, HOXB13, KIT, MITF, PDGFRA, POLD1, and POLE (sequencing only); EPCAM and GREM1 (deletion/duplication only). DNA and RNA analyses performed for * genes.    12/04/2021 - 01/01/2022 Radiation Therapy   Right breast: 42.56 Gy in 16 fractions, Boost: 10 GY in 5 fractions   02/2022 -  Anti-estrogen oral therapy   Tamoxifen    Interval History  She tolerated iron infusion very well. She continues to deal with menorrhagia, and is hoping to soon proceed with hysterectomy. She feels wonderful since she received iron infusion.  No other complaints today.  She is hoping to  start tamoxifen after she  moves forward with hysterectomy. Rest of the pertinent 10 point ROS reviewed and negative  MEDICAL HISTORY:  Past Medical History:  Diagnosis Date   Anemia    Cancer ([place]) 11/06/2021   right breast cancer- lobular carcinoma & ductal carcinoma / see 11/06/21 pathology report from right breast lumpectomy in Epic   COVID-19    late 2021 or early 2022 per pt, only mild symptoms   GERD (gastroesophageal reflux disease)    History of anemia 2022   chronic anemia, 10/28/21 Hgb 7.9 in Epic, 12/29/21 Hgb 8.9 / Patient has been receiving weekly iron infusions, 12/15/21 - 12/29/21. Patient received 5 more iron infusions in 01/2022.   Hx of radiation therapy    12/04/21 - 01/01/22 as of 12/31/21, radiation to right breast   Wears contact lenses    Wears glasses     SURGICAL HISTORY: Past Surgical History:  Procedure Laterality Date   BREAST LUMPECTOMY WITH RADIOACTIVE SEED LOCALIZATION Right 11/06/2021   Procedure: RIGHT BREAST LUMPECTOMY WITH RADIOACTIVE SEED LOCALIZATION;  Surgeon: Coralie Keens, MD;  Location: [place];  Service: General;  Laterality: Right;  LMA   CYSTOSCOPY N/A 02/25/2022   Procedure: Consuela Mimes;  Surgeon: Megan Salon, MD;  Location: [facility];  Service: Gynecology;  Laterality: N/A;   TOTAL LAPAROSCOPIC HYSTERECTOMY WITH SALPINGECTOMY Bilateral 02/25/2022   Procedure: TOTAL LAPAROSCOPIC HYSTERECTOMY WITH SALPINGECTOMY;  Surgeon: Megan Salon, MD;  Location: [facility];  Service: Gynecology;  Laterality: Bilateral;   TUBAL LIGATION  2008    SOCIAL HISTORY: Social History   Socioeconomic History   Marital status: Married    Spouse name: Not on file   Number of children: 2   Years of education: Not on file   Highest education level: High school graduate  Occupational History   Occupation: homemaker  Tobacco Use   Smoking status: Never   Smokeless tobacco: Never  Vaping Use   Vaping Use: Never used  Substance and Sexual Activity    Alcohol use: Never   Drug use: Never   Sexual activity: Yes    Birth control/protection: Pill, Surgical    Comment: tubal ligation  Other Topics Concern   Not on file  Social History Narrative   Not on file   Social Determinants of Health   Financial Resource Strain: Not on file  Food Insecurity: No Food Insecurity (08/05/2021)   Hunger Vital Sign    Worried About Running Out of Food in the Last Year: Never true    Ran Out of Food in the Last Year: Never true  Transportation Needs: No Transportation Needs (08/05/2021)   PRAPARE - Hydrologist (Medical): No    Lack of Transportation (Non-Medical): No  Physical Activity: Not on file  Stress: Not on file  Social Connections: Not on file  Intimate Partner Violence: Not At Risk (11/20/2021)   Humiliation, Afraid, Rape, and Kick questionnaire    Fear of Current or Ex-Partner: No    Emotionally Abused: No    Physically Abused: No    Sexually Abused: No    FAMILY HISTORY: Family History  Problem Relation Age of Onset   Heart disease Mother    Diabetes Mother    Heart attack Mother    Skin cancer Father        BCC   Fibroids Sister    Heart attack Maternal Uncle        <50   Diabetes  Maternal Grandmother    Congestive Heart Failure Maternal Grandmother    Heart attack Maternal Grandfather    Heart attack Paternal Grandfather     ALLERGIES:  is allergic to other.  MEDICATIONS:  Current Outpatient Medications  Medication Sig Dispense Refill   cyclobenzaprine (FLEXERIL) 10 MG tablet Take 1 tablet (10 mg total) by mouth every 8 (eight) hours as needed for muscle spasms. 20 tablet 0   fluticasone (FLONASE) 50 MCG/ACT nasal spray Place into both nostrils daily.     ibuprofen (ADVIL) 800 MG tablet Take 1 tablet by mouth every 8 (eight) hours as needed for mild pain, moderate pain or cramping. (Patient taking differently: Take 800 mg by mouth every 8 (eight) hours as needed for mild pain, moderate pain  or cramping. PRN) 30 tablet 0   meloxicam (MOBIC) 15 MG tablet Take 1 tablet (15 mg total) by mouth daily. 30 tablet 0   tamoxifen (NOLVADEX) 20 MG tablet Take 1 tablet (20 mg total) by mouth daily. (Patient taking differently: Take 20 mg by mouth daily. Started 03/11/22) 30 tablet 5   No current facility-administered medications for this visit.    Rest of the pertinent 10 point ROS reviewed and negative  PHYSICAL EXAMINATION: ECOG PERFORMANCE STATUS: 0 - Asymptomatic  Vitals:   08/06/22 1127  BP: 122/75  Pulse: 78  Resp: 16  Temp: 97.7 F (36.5 C)  SpO2: 100%   Filed Weights   08/06/22 1127  Weight: 110 lb 14.4 oz (50.3 kg)   Physical Exam Constitutional:      Appearance: Normal appearance.  Cardiovascular:     Rate and Rhythm: Normal rate and regular rhythm.     Pulses: Normal pulses.     Heart sounds: Normal heart sounds.  Pulmonary:     Effort: Pulmonary effort is normal.     Breath sounds: Normal breath sounds.  Abdominal:     General: Abdomen is flat. Bowel sounds are normal.     Palpations: Abdomen is soft.  Musculoskeletal:        General: No swelling or tenderness. Normal range of motion.     Cervical back: Normal range of motion and neck supple. No rigidity.  Lymphadenopathy:     Cervical: No cervical adenopathy.  Skin:    General: Skin is warm and dry.  Neurological:     General: No focal deficit present.     Mental Status: She is alert.      LABORATORY DATA:  I have reviewed the data as listed Lab Results  Component Value Date   WBC 6.1 08/06/2022   HGB 12.9 08/06/2022   HCT 40.2 08/06/2022   MCV 89.7 08/06/2022   PLT 324 08/06/2022   Lab Results  Component Value Date   NA 141 08/06/2022   K 5.1 08/06/2022   CL 108 08/06/2022   CO2 28 08/06/2022   Labs from today reviewed CBC showed hemoglobin of 8.7, microcytic, hypochromic, mild thrombocytosis Iron panel showed a TIBC of 535, iron saturation of 5, ferritin pending from  today  RADIOGRAPHIC STUDIES: I have personally reviewed the radiological reports and agreed with the findings in the report.  ASSESSMENT AND PLAN:  No problem-specific Assessment & Plan notes found for this encounter.   Total time spent: 30 minutes  All questions were answered. The patient knows to call the clinic with any problems, questions or concerns.    Benay Pike, MD 08/06/22

## 2022-08-12 ENCOUNTER — Ambulatory Visit: Payer: 59 | Admitting: Hematology and Oncology

## 2022-08-12 ENCOUNTER — Other Ambulatory Visit: Payer: 59

## 2022-08-21 NOTE — Progress Notes (Unsigned)
   Frances Craig D.[place] Phone: [phone]   Assessment and Plan:     There are no diagnoses linked to this encounter.  *** - Patient has received significant relief with OMT in the past.  Elects for repeat OMT today.  Tolerated well per note below. - Decision today to treat with OMT was based on Physical Exam   After verbal consent patient was treated with HVLA (high velocity low amplitude), ME (muscle energy), FPR (flex positional release), ST (soft tissue), PC/PD (Pelvic Compression/ Pelvic Decompression) techniques in cervical, rib, thoracic, lumbar, and pelvic areas. Patient tolerated the procedure well with improvement in symptoms.  Patient educated on potential side effects of soreness and recommended to rest, hydrate, and use Tylenol as needed for pain control.   Pertinent previous records reviewed include ***   Follow Up: ***     Subjective:   I, Frances Craig, am serving as a Education administrator for Doctor Glennon Mac   Chief Complaint: back pain    HPI:    06/29/2022 Patient is a 47 year old female complaining of back pain. Patient states that she had a hysterectomy July feels like its burning bilateral but more on the right thoracic to lumbar , has to stretch to be able to move , sleeping is hard, constant uncomfortableness, feels achy sometimes,, ib helps and does tae flexeril seems to help sleep bu the pain does not go away , goes get some numbness from her armpit to her elbow    07/27/2022 Patient states that she feel better but has that one spot on the right side that flares when she stands for long periods of time, meloxicam and stretching help    08/24/2022 Patient states    Relevant Historical Information: History of breast cancer  Additional pertinent review of systems negative.  Current Outpatient Medications  Medication Sig Dispense Refill   cyclobenzaprine (FLEXERIL) 10 MG tablet Take 1 tablet  (10 mg total) by mouth every 8 (eight) hours as needed for muscle spasms. 20 tablet 0   fluticasone (FLONASE) 50 MCG/ACT nasal spray Place into both nostrils daily.     ibuprofen (ADVIL) 800 MG tablet Take 1 tablet by mouth every 8 (eight) hours as needed for mild pain, moderate pain or cramping. (Patient taking differently: Take 800 mg by mouth every 8 (eight) hours as needed for mild pain, moderate pain or cramping. PRN) 30 tablet 0   meloxicam (MOBIC) 15 MG tablet Take 1 tablet (15 mg total) by mouth daily. 30 tablet 0   tamoxifen (NOLVADEX) 20 MG tablet Take 1 tablet (20 mg total) by mouth daily. (Patient taking differently: Take 20 mg by mouth daily. Started 03/11/22) 30 tablet 5   No current facility-administered medications for this visit.      Objective:     There were no vitals filed for this visit.    There is no height or weight on file to calculate BMI.    Physical Exam:     General: Well-appearing, cooperative, sitting comfortably in no acute distress.   OMT Physical Exam:  ASIS Compression Test: Positive Right Cervical: TTP paraspinal, *** Rib: Bilateral elevated first rib with TTP Thoracic: TTP paraspinal,*** Lumbar: TTP paraspinal,*** Pelvis: Right anterior innominate  Electronically signed by:  Frances Craig D.[facility] 8:33 AM 08/21/22

## 2022-08-24 ENCOUNTER — Ambulatory Visit (INDEPENDENT_AMBULATORY_CARE_PROVIDER_SITE_OTHER): Payer: Commercial Managed Care - HMO | Admitting: Sports Medicine

## 2022-08-24 VITALS — BP 120/82 | HR 91 | Ht 61.0 in | Wt 112.0 lb

## 2022-08-24 DIAGNOSIS — M545 Low back pain, unspecified: Secondary | ICD-10-CM

## 2022-08-24 DIAGNOSIS — M546 Pain in thoracic spine: Secondary | ICD-10-CM | POA: Diagnosis not present

## 2022-08-24 DIAGNOSIS — M9902 Segmental and somatic dysfunction of thoracic region: Secondary | ICD-10-CM

## 2022-08-24 DIAGNOSIS — M9903 Segmental and somatic dysfunction of lumbar region: Secondary | ICD-10-CM | POA: Diagnosis not present

## 2022-08-24 DIAGNOSIS — M9905 Segmental and somatic dysfunction of pelvic region: Secondary | ICD-10-CM

## 2022-08-24 DIAGNOSIS — G8929 Other chronic pain: Secondary | ICD-10-CM

## 2022-08-24 NOTE — Patient Instructions (Signed)
Good to see you   

## 2022-09-06 ENCOUNTER — Encounter: Payer: Self-pay | Admitting: Hematology and Oncology

## 2022-09-07 ENCOUNTER — Other Ambulatory Visit: Payer: Self-pay

## 2022-09-07 MED ORDER — TAMOXIFEN CITRATE 20 MG PO TABS: 20.0000 mg | ORAL_TABLET | Freq: Every day | ORAL | 3 refills | Status: DC

## 2022-09-16 ENCOUNTER — Ambulatory Visit
Admission: RE | Admit: 2022-09-16 | Discharge: 2022-09-16 | Disposition: A | Discharge status: Home / Self Care | Payer: Commercial Managed Care - HMO | Source: Ambulatory Visit | Attending: Adult Health | Admitting: Adult Health

## 2022-09-16 ENCOUNTER — Other Ambulatory Visit: Payer: Commercial Managed Care - HMO

## 2022-09-16 ENCOUNTER — Ambulatory Visit: Admission: RE | Admit: 2022-09-16 | Payer: Commercial Managed Care - HMO | Source: Ambulatory Visit

## 2022-09-16 DIAGNOSIS — D0511 Intraductal carcinoma in situ of right breast: Secondary | ICD-10-CM

## 2022-10-02 NOTE — Progress Notes (Unsigned)
    Frances Craig D.Vining Langeloth Phone: [phone]   Assessment and Plan:     There are no diagnoses linked to this encounter.  ***   Pertinent previous records reviewed include ***   Follow Up: ***     Subjective:   I, Frances Craig, am serving as a Education administrator for Doctor Glennon Mac   Chief Complaint: back pain    HPI:    06/29/2022 Patient is a 47 year old female complaining of back pain. Patient states that she had a hysterectomy July feels like its burning bilateral but more on the right thoracic to lumbar , has to stretch to be able to move , sleeping is hard, constant uncomfortableness, feels achy sometimes,, ib helps and does tae flexeril seems to help sleep bu the pain does not go away , goes get some numbness from her armpit to her elbow    07/27/2022 Patient states that she feel better but has that one spot on the right side that flares when she stands for long periods of time, meloxicam and stretching help    08/24/2022 Patient states that she is good, so much better than where she started    10/05/2022 Patient states    Relevant Historical Information: History of breast cancer  Additional pertinent review of systems negative.   Current Outpatient Medications:    cyclobenzaprine (FLEXERIL) 10 MG tablet, Take 1 tablet (10 mg total) by mouth every 8 (eight) hours as needed for muscle spasms., Disp: 20 tablet, Rfl: 0   fluticasone (FLONASE) 50 MCG/ACT nasal spray, Place into both nostrils daily., Disp: , Rfl:    ibuprofen (ADVIL) 800 MG tablet, Take 1 tablet by mouth every 8 (eight) hours as needed for mild pain, moderate pain or cramping. (Patient taking differently: Take 800 mg by mouth every 8 (eight) hours as needed for mild pain, moderate pain or cramping. PRN), Disp: 30 tablet, Rfl: 0   meloxicam (MOBIC) 15 MG tablet, Take 1 tablet (15 mg total) by mouth daily., Disp: 30 tablet, Rfl: 0    tamoxifen (NOLVADEX) 20 MG tablet, Take 1 tablet (20 mg total) by mouth daily. Started 03/11/22, Disp: 90 tablet, Rfl: 3   Objective:     There were no vitals filed for this visit.    There is no height or weight on file to calculate BMI.    Physical Exam:    ***   Electronically signed by:  Frances Craig D.[facility] 7:31 AM 10/02/22

## 2022-10-05 ENCOUNTER — Ambulatory Visit (INDEPENDENT_AMBULATORY_CARE_PROVIDER_SITE_OTHER): Payer: Commercial Managed Care - HMO | Admitting: Sports Medicine

## 2022-10-05 VITALS — BP 110/80 | HR 98 | Ht 61.0 in | Wt 112.0 lb

## 2022-10-05 DIAGNOSIS — M546 Pain in thoracic spine: Secondary | ICD-10-CM

## 2022-10-05 DIAGNOSIS — M9903 Segmental and somatic dysfunction of lumbar region: Secondary | ICD-10-CM | POA: Diagnosis not present

## 2022-10-05 DIAGNOSIS — M545 Low back pain, unspecified: Secondary | ICD-10-CM

## 2022-10-05 DIAGNOSIS — G8929 Other chronic pain: Secondary | ICD-10-CM

## 2022-10-05 DIAGNOSIS — M9905 Segmental and somatic dysfunction of pelvic region: Secondary | ICD-10-CM | POA: Diagnosis not present

## 2022-10-05 DIAGNOSIS — M9902 Segmental and somatic dysfunction of thoracic region: Secondary | ICD-10-CM | POA: Diagnosis not present

## 2022-10-05 NOTE — Patient Instructions (Signed)
Good to see you   

## 2022-10-29 ENCOUNTER — Encounter: Payer: Self-pay | Admitting: Gastroenterology

## 2022-11-09 ENCOUNTER — Encounter: Payer: Self-pay | Admitting: Gastroenterology

## 2022-11-16 ENCOUNTER — Ambulatory Visit: Payer: Commercial Managed Care - HMO | Admitting: Sports Medicine

## 2022-12-21 ENCOUNTER — Encounter (HOSPITAL_BASED_OUTPATIENT_CLINIC_OR_DEPARTMENT_OTHER): Payer: Self-pay | Admitting: Obstetrics & Gynecology

## 2022-12-21 NOTE — Telephone Encounter (Signed)
Dr Hyacinth Meeker, with the patient's history what do you recommend?  Thanks Sherrilyn Rist

## 2022-12-29 ENCOUNTER — Ambulatory Visit (AMBULATORY_SURGERY_CENTER): Payer: Commercial Managed Care - HMO | Admitting: *Deleted

## 2022-12-29 VITALS — Ht 61.0 in | Wt 108.0 lb

## 2022-12-29 DIAGNOSIS — Z1211 Encounter for screening for malignant neoplasm of colon: Secondary | ICD-10-CM

## 2022-12-29 MED ORDER — NA SULFATE-K SULFATE-MG SULF 17.5-3.13-1.6 GM/177ML PO SOLN: 1.0000 | Freq: Once | ORAL | 0 refills | Status: AC

## 2022-12-29 NOTE — Progress Notes (Signed)

## 2023-01-07 ENCOUNTER — Ambulatory Visit
Admission: RE | Admit: 2023-01-07 | Discharge: 2023-01-07 | Disposition: A | Discharge status: Home / Self Care | Payer: Commercial Managed Care - HMO | Source: Ambulatory Visit | Attending: Radiation Oncology | Admitting: Radiation Oncology

## 2023-01-07 ENCOUNTER — Encounter: Payer: Self-pay | Admitting: Radiation Oncology

## 2023-01-07 VITALS — BP 104/70 | HR 79 | Temp 98.0°F | Resp 16 | Ht 61.0 in | Wt 110.3 lb

## 2023-01-07 DIAGNOSIS — Z79810 Long term (current) use of selective estrogen receptor modulators (SERMs): Secondary | ICD-10-CM | POA: Insufficient documentation

## 2023-01-07 DIAGNOSIS — D0511 Intraductal carcinoma in situ of right breast: Secondary | ICD-10-CM | POA: Insufficient documentation

## 2023-01-07 DIAGNOSIS — Z170 Estrogen receptor positive status [ER+]: Secondary | ICD-10-CM | POA: Diagnosis not present

## 2023-01-07 DIAGNOSIS — Z923 Personal history of irradiation: Secondary | ICD-10-CM | POA: Insufficient documentation

## 2023-01-07 NOTE — Progress Notes (Signed)
Radiation Oncology Follow up Note  Name: Frances Craig   Date:   01/07/2023 MRN:  [id number] DOB: 15-Jan-1976    This 47 y.o. 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  REFERRING PROVIDER: No ref. 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  Seen today in routine follow-up she is doing well.  She specifically denies breast tenderness cough or bone pain..  She mammograms back in January to I have reviewed were BI-RADS 2 benign.  She is currently on tamoxifen tolerating that well without side effect  COMPLICATIONS OF TREATMENT: none  FOLLOW UP COMPLIANCE: keeps appointments   PHYSICAL EXAM:  BP 104/70 (BP Location: Left Arm, Patient Position: Sitting)   Pulse 79   Temp 98 F (36.7 C) (Oral)   Resp 16   Ht 5\' 1"  (1.549 m)   Wt 110 lb 4.8 oz (50 kg)   LMP 07/16/2021 (Exact Date)   SpO2 98%   BMI 20.84 kg/m  Lungs are clear to A&P cardiac examination essentially unremarkable with regular rate and rhythm. No dominant mass or nodularity is noted in either breast in 2 positions examined. Incision is well-healed. No axillary or supraclavicular adenopathy is appreciated. Cosmetic result is excellent.  Well-developed well-nourished patient in NAD. HEENT reveals PERLA, EOMI, discs not visualized.  Oral cavity is clear. No oral mucosal lesions are identified. Neck is clear without evidence of cervical or supraclavicular adenopathy. Lungs are clear to A&P. Cardiac examination is essentially unremarkable with regular rate and rhythm without murmur rub or thrill. Abdomen is benign with no organomegaly or masses noted. Motor sensory and DTR levels are equal and symmetric in the upper and lower extremities. Cranial nerves II through XII are grossly intact.  Proprioception is intact. No peripheral adenopathy or edema is identified. No motor or sensory levels are noted. Crude visual fields are within normal range.  RADIOLOGY RESULTS: Mammograms reviewed compatible with above-stated findings  PLAN: Present time patient is doing well 1 year out with no evidence of disease.  And pleased with her overall progress.  Of asked to see her back in 1 year for follow-up.  She continues on tamoxifen without side effect.  Patient is to call with any concerns.  I would like to take this opportunity to thank you for allowing me to participate in the care of your patient.Carmina Miller, MD

## 2023-01-22 ENCOUNTER — Ambulatory Visit (AMBULATORY_SURGERY_CENTER): Payer: Commercial Managed Care - HMO | Admitting: Gastroenterology

## 2023-01-22 ENCOUNTER — Encounter: Payer: Self-pay | Admitting: Gastroenterology

## 2023-01-22 VITALS — BP 101/67 | HR 74 | Temp 98.7°F | Resp 10 | Ht 61.0 in | Wt 108.0 lb

## 2023-01-22 DIAGNOSIS — D124 Benign neoplasm of descending colon: Secondary | ICD-10-CM | POA: Diagnosis not present

## 2023-01-22 DIAGNOSIS — Z1211 Encounter for screening for malignant neoplasm of colon: Secondary | ICD-10-CM | POA: Diagnosis present

## 2023-01-22 HISTORY — PX: COLONOSCOPY: SHX174

## 2023-01-22 MED ORDER — SODIUM CHLORIDE 0.9 % IV SOLN: 500.0000 mL | INTRAVENOUS | Status: DC

## 2023-01-22 NOTE — Op Note (Signed)
[facility] Patient Name: Frances Craig Procedure Date: 01/22/2023 8:39 AM MRN: [id number] Endoscopist: Doristine Locks , MD, [id number] Age: 47 Referring MD:  Date of Birth: 08/22/1975 Gender: Female Account #: [id number] Procedure:                Colonoscopy Indications:              Screening for colorectal malignant neoplasm, This                            is the patient's first colonoscopy Medicines:                Monitored Anesthesia Care Procedure:                Pre-Anesthesia Assessment:                           - Prior to the procedure, a History and Physical                            was performed, and patient medications and                            allergies were reviewed. The patient's tolerance of                            previous anesthesia was also reviewed. The risks                            and benefits of the procedure and the sedation                            options and risks were discussed with the patient.                            All questions were answered, and informed consent                            was obtained. Prior Anticoagulants: The patient has                            taken no anticoagulant or antiplatelet agents. ASA                            Grade Assessment: II - A patient with mild systemic                            disease. After reviewing the risks and benefits,                            the patient was deemed in satisfactory condition to                            undergo the procedure.  After obtaining informed consent, the colonoscope                            was passed under direct vision. Throughout the                            procedure, the patient's blood pressure, pulse, and                            oxygen saturations were monitored continuously. The                            [id number]-HQ190L Colonoscope [id number] was introduced                            through the anus and  advanced to the the cecum,                            identified by appendiceal orifice and ileocecal                            valve. The colonoscopy was performed without                            difficulty. The patient tolerated the procedure                            well. The quality of the bowel preparation was                            good. The ileocecal valve, appendiceal orifice, and                            rectum were photographed. Scope In: 8:41:50 AM Scope Out: 8:59:45 AM Scope Withdrawal Time: 0 hours 15 minutes 11 seconds  Total Procedure Duration: 0 hours 17 minutes 55 seconds  Findings:                 The perianal and digital rectal examinations were                            normal.                           A 6 mm polyp was found in the descending colon. The                            polyp was sessile. The polyp was removed with a                            cold snare. Resection and retrieval were complete.                            Estimated blood loss was minimal.  The retroflexed view of the distal rectum and anal                            verge was normal and showed no anal or rectal                            abnormalities. Complications:            No immediate complications. Estimated Blood Loss:     Estimated blood loss was minimal. Impression:               - One 6 mm polyp in the descending colon, removed                            with a cold snare. Resected and retrieved.                           - The distal rectum and anal verge are normal on                            retroflexion view.                           - The remainder of the colon was normal appearing.                           - The GI Genius (intelligent endoscopy module),                            computer-aided polyp detection system powered by AI                            was utilized to detect colorectal polyps through                             enhanced visualization during colonoscopy. Recommendation:           - Patient has a contact number available for                            emergencies. The signs and symptoms of potential                            delayed complications were discussed with the                            patient. Return to normal activities tomorrow.                            Written discharge instructions were provided to the                            patient.                           -  Resume previous diet.                           - Continue present medications.                           - Await pathology results.                           - Repeat colonoscopy for surveillance based on                            pathology results.                           - Return to GI office PRN. Doristine Locks, MD 01/22/2023 9:04:57 AM

## 2023-01-22 NOTE — Progress Notes (Signed)
Patient states there have been no changes to medical or surgical history since time of pre-visit. 

## 2023-01-22 NOTE — Progress Notes (Signed)
Sedate, gd SR, tolerated procedure well, VSS, report to RN 

## 2023-01-22 NOTE — Progress Notes (Signed)
Called to room to assist during endoscopic procedure.  Patient ID and intended procedure confirmed with present staff. Received instructions for my participation in the procedure from the performing physician.  

## 2023-01-22 NOTE — Progress Notes (Signed)
GASTROENTEROLOGY PROCEDURE H&P NOTE   Primary Care Physician: Pcp, No    Reason for Procedure:  Colon Cancer screening  Plan:    Colonoscopy  Patient is appropriate for endoscopic procedure(s) in the ambulatory ([facility]) setting.  The nature of the procedure, as well as the risks, benefits, and alternatives were carefully and thoroughly reviewed with the patient. Ample time for discussion and questions allowed. The patient understood, was satisfied, and agreed to proceed.     HPI: Frances Craig is a 47 y.o. female who presents for colonoscopy for routine Colon Cancer screening.  No active GI symptoms.  Cousin with colon cancer, but otherwise no known FDRs with colon cancer or related malignancy.  Patient is otherwise without complaints or active issues today.  Past Medical History:  Diagnosis Date   Anemia    Cancer (HCC) 11/06/2021   right breast cancer- lobular carcinoma & ductal carcinoma / see 11/06/21 pathology report from right breast lumpectomy in Epic   COVID-19    late 2021 or early 2022 per pt, only mild symptoms   GERD (gastroesophageal reflux disease)    History of anemia 2022   chronic anemia, 10/28/21 Hgb 7.9 in Epic, 12/29/21 Hgb 8.9 / Patient has been receiving weekly iron infusions, 12/15/21 - 12/29/21. Patient received 5 more iron infusions in 01/2022.   Hx of radiation therapy    12/04/21 - 01/01/22 as of 12/31/21, radiation to right breast   Wears contact lenses    Wears glasses     Past Surgical History:  Procedure Laterality Date   BREAST LUMPECTOMY WITH RADIOACTIVE SEED LOCALIZATION Right 11/06/2021   Procedure: RIGHT BREAST LUMPECTOMY WITH RADIOACTIVE SEED LOCALIZATION;  Surgeon: Abigail Miyamoto, MD;  Location: [facility] OR;  Service: General;  Laterality: Right;  LMA   CYSTOSCOPY N/A 02/25/2022   Procedure: Bluford Kaufmann;  Surgeon: Jerene Bears, MD;  Location: [facility];  Service: Gynecology;  Laterality: N/A;   TOTAL LAPAROSCOPIC HYSTERECTOMY  WITH SALPINGECTOMY Bilateral 02/25/2022   Procedure: TOTAL LAPAROSCOPIC HYSTERECTOMY WITH SALPINGECTOMY;  Surgeon: Jerene Bears, MD;  Location: [facility];  Service: Gynecology;  Laterality: Bilateral;   TUBAL LIGATION  2008    Prior to Admission medications   Medication Sig Start Date End Date Taking? Authorizing Provider  fluticasone (FLONASE) 50 MCG/ACT nasal spray Place into both nostrils daily.    [provider]  ibuprofen (ADVIL) 800 MG tablet Take 1 tablet by mouth every 8 (eight) hours as needed for mild pain, moderate pain or cramping. Patient not taking: Reported on 12/29/2022 02/25/22   Jerene Bears, MD  Na Sulfate-K Sulfate-Mg Sulf 17.5-3.13-1.6 GM/177ML SOLN Take by mouth. 12/29/22   [provider]  tamoxifen (NOLVADEX) 20 MG tablet Take 1 tablet (20 mg total) by mouth daily. Started 03/11/22 09/07/22   Rachel Moulds, MD    Current Outpatient Medications  Medication Sig Dispense Refill   fluticasone (FLONASE) 50 MCG/ACT nasal spray Place into both nostrils daily.     ibuprofen (ADVIL) 800 MG tablet Take 1 tablet by mouth every 8 (eight) hours as needed for mild pain, moderate pain or cramping. (Patient not taking: Reported on 12/29/2022) 30 tablet 0   Na Sulfate-K Sulfate-Mg Sulf 17.5-3.13-1.6 GM/177ML SOLN Take by mouth.     tamoxifen (NOLVADEX) 20 MG tablet Take 1 tablet (20 mg total) by mouth daily. Started 03/11/22 90 tablet 3   Current Facility-Administered Medications  Medication Dose Route Frequency Provider Last Rate Last Admin   0.9 %  sodium chloride infusion  500 mL Intravenous Continuous Juliette Standre V, DO        Allergies as of 01/22/2023 - Review Complete 01/22/2023  Allergen Reaction Noted   Other Dermatitis 04/02/2022    Family History  Problem Relation Age of Onset   Heart disease Mother    Diabetes Mother    Heart attack Mother    Skin cancer Father        BCC   Diverticulosis Father    Fibroids Sister     Heart attack Maternal Uncle        <50   Diabetes Maternal Grandmother    Congestive Heart Failure Maternal Grandmother    Heart attack Maternal Grandfather    Heart attack Paternal Grandfather    Colon cancer Cousin    Colon polyps Neg Hx    Crohn's disease Neg Hx    Esophageal cancer Neg Hx    Rectal cancer Neg Hx    Stomach cancer Neg Hx    Ulcerative colitis Neg Hx     Social History   Socioeconomic History   Marital status: Married    Spouse name: Not on file   Number of children: 2   Years of education: Not on file   Highest education level: High school graduate  Occupational History   Occupation: homemaker  Tobacco Use   Smoking status: Never   Smokeless tobacco: Never  Vaping Use   Vaping Use: Never used  Substance and Sexual Activity   Alcohol use: Never   Drug use: Never   Sexual activity: Yes    Birth control/protection: Pill, Surgical    Comment: tubal ligation  Other Topics Concern   Not on file  Social History Narrative   Not on file   Social Determinants of Health   Financial Resource Strain: Not on file  Food Insecurity: No Food Insecurity (08/05/2021)   Hunger Vital Sign    Worried About Running Out of Food in the Last Year: Never true    Ran Out of Food in the Last Year: Never true  Transportation Needs: No Transportation Needs (08/05/2021)   PRAPARE - Administrator, Civil Service (Medical): No    Lack of Transportation (Non-Medical): No  Physical Activity: Not on file  Stress: Not on file  Social Connections: Not on file  Intimate Partner Violence: Not At Risk (11/20/2021)   Humiliation, Afraid, Rape, and Kick questionnaire    Fear of Current or Ex-Partner: No    Emotionally Abused: No    Physically Abused: No    Sexually Abused: No    Physical Exam: Vital signs in last 24 hours: @BP  119/75   Pulse 94   Temp 98.7 F (37.1 C) (Skin)   Ht 5\' 1"  (1.549 m)   Wt 108 lb (49 kg)   LMP 07/16/2021 (Exact Date)   SpO2 100%    BMI 20.41 kg/m  GEN: NAD EYE: Sclerae anicteric ENT: MMM CV: Non-tachycardic Pulm: CTA b/l GI: Soft, NT/ND NEURO:  Alert & Oriented x 3   Doristine Locks, DO [facility]   01/22/2023 7:53 AM

## 2023-01-22 NOTE — Patient Instructions (Addendum)
Resume previous diet Continue present medications Await pathology results  Handouts/information given for polyps  YOU HAD AN ENDOSCOPIC PROCEDURE TODAY AT THE [facility]:   Refer to the procedure report that was given to you for any specific questions about what was found during the examination.  If the procedure report does not answer your questions, please call your gastroenterologist to clarify.  If you requested that your care partner not be given the details of your procedure findings, then the procedure report has been included in a sealed envelope for you to review at your convenience later.  YOU SHOULD EXPECT: Some feelings of bloating in the abdomen. Passage of more gas than usual.  Walking can help get rid of the air that was put into your GI tract during the procedure and reduce the bloating. If you had a lower endoscopy (such as a colonoscopy or flexible sigmoidoscopy) you may notice spotting of blood in your stool or on the toilet paper. If you underwent a bowel prep for your procedure, you may not have a normal bowel movement for a few days.  Please Note:  You might notice some irritation and congestion in your nose or some drainage.  This is from the oxygen used during your procedure.  There is no need for concern and it should clear up in a day or so.  SYMPTOMS TO REPORT IMMEDIATELY:  Following lower endoscopy (colonoscopy):  Excessive amounts of blood in the stool  Significant tenderness or worsening of abdominal pains  Swelling of the abdomen that is new, acute  Fever of 100F or higher  For urgent or emergent issues, a gastroenterologist can be reached at any hour by calling [phone]. Do not use MyChart messaging for urgent concerns.    DIET:  We do recommend a small meal at first, but then you may proceed to your regular diet.  Drink plenty of fluids but you should avoid alcoholic beverages for 24 hours.  ACTIVITY:  You should plan to take it easy for  the rest of today and you should NOT DRIVE or use heavy machinery until tomorrow (because of the sedation medicines used during the test).    FOLLOW UP: Our staff will call the number listed on your records the next business day following your procedure.  We will call around 7:15- 8:00 am to check on you and address any questions or concerns that you may have regarding the information given to you following your procedure. If we do not reach you, we will leave a message.     If any biopsies were taken you will be contacted by phone or by letter within the next 1-3 weeks.  Please call us at [phone] if you have not heard about the biopsies in 3 weeks.    SIGNATURES/CONFIDENTIALITY: You and/or your care partner have signed paperwork which will be entered into your electronic medical record.  These signatures attest to the fact that that the information above on your After Visit Summary has been reviewed and is understood.  Full responsibility of the confidentiality of this discharge information lies with you and/or your care-partner. 

## 2023-01-25 ENCOUNTER — Telehealth: Payer: Self-pay | Admitting: *Deleted

## 2023-01-25 NOTE — Telephone Encounter (Signed)
  Follow up Call-     01/22/2023    7:36 AM  Call back number  Post procedure Call Back phone  # [phone]  Permission to leave phone message Yes   [facility]

## 2023-04-01 ENCOUNTER — Encounter (HOSPITAL_BASED_OUTPATIENT_CLINIC_OR_DEPARTMENT_OTHER): Payer: Self-pay | Admitting: Obstetrics & Gynecology

## 2023-04-01 ENCOUNTER — Ambulatory Visit (INDEPENDENT_AMBULATORY_CARE_PROVIDER_SITE_OTHER): Payer: Commercial Managed Care - HMO | Admitting: Obstetrics & Gynecology

## 2023-04-01 VITALS — BP 117/83 | HR 94 | Ht 61.0 in | Wt 113.0 lb

## 2023-04-01 DIAGNOSIS — Z90710 Acquired absence of both cervix and uterus: Secondary | ICD-10-CM | POA: Diagnosis not present

## 2023-04-01 DIAGNOSIS — N951 Menopausal and female climacteric states: Secondary | ICD-10-CM | POA: Diagnosis not present

## 2023-04-01 NOTE — Progress Notes (Signed)
GYNECOLOGY  VISIT  CC:   recheck after hysterectomy  HPI: 47 y.o. [id number] [name] here for recheck 1 year after hysterectomy.  She is so pleased with not having bleeding.  Did have post op cuff dehiscence.  This was not full thickness and did heal with conservative management.  Denies any vaginal bleeding or pain with intercourse.  She is having some hot flashes at this point.  These are mostly at night.  She does have some during the day as well.  Feels sleep is ok.  Options for treatment of hot flashes discussed, specifically Veozah and gabapentin.   Past Medical History:  Diagnosis Date   Anemia    Cancer (HCC) 11/06/2021   right breast cancer- lobular carcinoma & ductal carcinoma / see 11/06/21 pathology report from right breast lumpectomy in Epic   COVID-19    late 2021 or early 2022 per pt, only mild symptoms   GERD (gastroesophageal reflux disease)    History of anemia 2022   chronic anemia, 10/28/21 Hgb 7.9 in Epic, 12/29/21 Hgb 8.9 / Patient has been receiving weekly iron infusions, 12/15/21 - 12/29/21. Patient received 5 more iron infusions in 01/2022.   Hx of radiation therapy    12/04/21 - 01/01/22 as of 12/31/21, radiation to right breast   Wears contact lenses    Wears glasses     MEDS:   Current Outpatient Medications on File Prior to Visit  Medication Sig Dispense Refill   fluticasone (FLONASE) 50 MCG/ACT nasal spray Place into both nostrils daily.     tamoxifen (NOLVADEX) 20 MG tablet Take 1 tablet (20 mg total) by mouth daily. Started 03/11/22 90 tablet 3   No current facility-administered medications on file prior to visit.    ALLERGIES: Other  SH:  married, non smoker  Review of Systems  Constitutional: Negative.   Genitourinary: Negative.     PHYSICAL EXAMINATION:    BP 117/83 (BP Location: Right Arm, Patient Position: Sitting, Cuff Size: Normal)   Pulse 94   Ht 5\' 1"  (1.549 m) Comment: Reported  Wt 113 lb (51.3 kg)   LMP 07/16/2021  (Exact Date)   BMI 21.35 kg/m     General appearance: alert, cooperative and appears stated age Abdomen: soft, non-tender; bowel sounds normal; no masses,  no organomegaly Lymph:  no inguinal LAD noted  Pelvic: External genitalia:  no lesions              Urethra:  normal appearing urethra with no masses, tenderness or lesions              Bartholins and Skenes: normal                 Vagina: normal appearing vagina with normal color and discharge, no lesions              Cervix: absent              Bimanual Exam:  Uterus:  uterus absent              Adnexa: no mass, fullness, tenderness           Chaperone not available when pelvic exam was performed.    Assessment/Plan: 1. H/O: hysterectomy - doing well - options for non hormonal treatment for vaginal dryness - pt will continue to follow with oncology and knows I am happy to be part of her team when/if needed.  Routine pap smears not recommended due to her hx  and normal cervical pathology with hysterectomy specimen.    2. Perimenopausal vasomotor symptoms - options reviewed.  For now she is going to monitor and will let me know if desires to start any treatments.   Total time with pt and documentation:  23 minutes

## 2023-05-28 ENCOUNTER — Encounter (HOSPITAL_BASED_OUTPATIENT_CLINIC_OR_DEPARTMENT_OTHER): Payer: Self-pay | Admitting: Obstetrics & Gynecology

## 2023-07-28 ENCOUNTER — Telehealth: Payer: Self-pay | Admitting: Hematology and Oncology

## 2023-08-09 ENCOUNTER — Ambulatory Visit: Payer: Commercial Managed Care - HMO | Admitting: Hematology and Oncology

## 2023-08-10 ENCOUNTER — Encounter: Payer: Self-pay | Admitting: Hematology and Oncology

## 2023-08-17 ENCOUNTER — Inpatient Hospital Stay: Payer: Commercial Managed Care - HMO | Attending: Adult Health | Admitting: Adult Health

## 2023-08-17 ENCOUNTER — Encounter: Payer: Self-pay | Admitting: Adult Health

## 2023-08-17 VITALS — BP 101/53 | HR 70 | Temp 98.1°F | Resp 18 | Ht 61.0 in | Wt 117.5 lb

## 2023-08-17 DIAGNOSIS — Z170 Estrogen receptor positive status [ER+]: Secondary | ICD-10-CM | POA: Diagnosis not present

## 2023-08-17 DIAGNOSIS — D0511 Intraductal carcinoma in situ of right breast: Secondary | ICD-10-CM | POA: Diagnosis not present

## 2023-08-17 DIAGNOSIS — Z79810 Long term (current) use of selective estrogen receptor modulators (SERMs): Secondary | ICD-10-CM | POA: Diagnosis not present

## 2023-08-17 MED ORDER — TAMOXIFEN CITRATE 20 MG PO TABS: 20.0000 mg | ORAL_TABLET | Freq: Every day | ORAL | 3 refills | Status: DC

## 2023-08-17 NOTE — Progress Notes (Signed)
 [facility] Cancer Follow up:    Pcp, No No address on file   DIAGNOSIS:  Cancer Staging  Ductal carcinoma in situ (DCIS) of right breast Staging form: Breast, AJCC 8th Edition - Clinical stage from 11/06/2021: Stage 0 (cTis (DCIS), cN0, cM0, ER+, PR+) - Signed by Crawford Morna Pickle, NP on 04/01/2022 Stage prefix: Initial diagnosis   SUMMARY OF ONCOLOGIC HISTORY: Oncology History  Ductal carcinoma in situ (DCIS) of right breast  11/06/2021 Cancer Staging   Staging form: Breast, AJCC 8th Edition - Clinical stage from 11/06/2021: Stage 0 (cTis (DCIS), cN0, cM0, ER+, PR+) - Signed by Crawford Morna Pickle, NP on 04/01/2022 Stage prefix: Initial diagnosis   11/06/2021 Surgery   Right breast lumpectomy: Lobular carcinoma in situ involving a complex sclerosing lesion, and the additional superior deep margin excision ductal carcinoma in situ was noted that was 1 cm ER/PR positive.   11/20/2021 Initial Diagnosis   Ductal carcinoma in situ (DCIS) of right breast   12/02/2021 Genetic Testing   Negative genetic testing on the CancerNext-Expanded+RNAinsight panel.  The report date is 12/02/2021.  The CancerNext-Expanded gene panel offered by [facility] and includes sequencing and rearrangement analysis for the following 77 genes: AIP, ALK, APC*, ATM*, AXIN2, BAP1, BARD1, BLM, BMPR1A, BRCA1*, BRCA2*, BRIP1*, CDC73, CDH1*, CDK4, CDKN1B, CDKN2A, CHEK2*, CTNNA1, DICER1, FANCC, FH, FLCN, GALNT12, KIF1B, LZTR1, MAX, MEN1, MET, MLH1*, MSH2*, MSH3, MSH6*, MUTYH*, NBN, NF1*, NF2, NTHL1, PALB2*, PHOX2B, PMS2*, POT1, PRKAR1A, PTCH1, PTEN*, RAD51C*, RAD51D*, RB1, RECQL, RET, SDHA, SDHAF2, SDHB, SDHC, SDHD, SMAD4, SMARCA4, SMARCB1, SMARCE1, STK11, SUFU, TMEM127, TP53*, TSC1, TSC2, VHL and XRCC2 (sequencing and deletion/duplication); EGFR, EGLN1, HOXB13, KIT, MITF, PDGFRA, POLD1, and POLE (sequencing only); EPCAM and GREM1 (deletion/duplication only). DNA and RNA analyses performed for * genes.     12/04/2021 - 01/01/2022 Radiation Therapy   Right breast: 42.56 Gy in 16 fractions, Boost: 10 GY in 5 fractions   02/2022 -  Anti-estrogen oral therapy   Tamoxifen      CURRENT THERAPY: Tamoxifen   INTERVAL HISTORY: Frances Craig 47 y.o. female returns for follow-up of her breast cancer currently on tamoxifen . She has been adhering to her prescribed regimen of tamoxifen , which has been associated with intermittent hot flashes, primarily at night. These episodes are described as a sensation of heat radiating from within, lasting a few seconds but perceived as minutes, and then dissipating. The frequency of these hot flashes is variable, sometimes occurring once a week. The patient also notes a decrease in sexual drive since starting tamoxifen .  In addition to the breast cancer history, the patient underwent a hysterectomy, the details of which were not discussed. She also had a colonoscopy and mammogram, both of which showed no concerning findings. The patient is due for another mammogram at the end of January/early February, which has not yet been scheduled.  The patient's lifestyle includes a diet rich in fruits and vegetables, but she does not engage in intentional exercise, although she is physically active. 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   Patient Active Problem List   Diagnosis Date Noted   Genetic testing 12/04/2021   Ductal carcinoma in situ (DCIS) of right breast 11/20/2021    is allergic to other.  MEDICAL HISTORY: Past Medical History:  Diagnosis Date   Anemia    Cancer (HCC) 11/06/2021   right breast cancer- lobular carcinoma & ductal carcinoma / see 11/06/21 pathology report from right breast lumpectomy in Epic  COVID-19    late 2021 or early 2022 per pt, only mild symptoms   GERD (gastroesophageal reflux disease)    History of anemia 2022   chronic anemia, 10/28/21 Hgb 7.9 in Epic, 12/29/21 Hgb 8.9 / Patient has  been receiving weekly iron  infusions, 12/15/21 - 12/29/21. Patient received 5 more iron  infusions in 01/2022.   Hx of radiation therapy    12/04/21 - 01/01/22 as of 12/31/21, radiation to right breast   Wears contact lenses    Wears glasses     SURGICAL HISTORY: Past Surgical History:  Procedure Laterality Date   BREAST LUMPECTOMY WITH RADIOACTIVE SEED LOCALIZATION Right 11/06/2021   Procedure: RIGHT BREAST LUMPECTOMY WITH RADIOACTIVE SEED LOCALIZATION;  Surgeon: Vernetta Berg, MD;  Location: [facility] OR;  Service: General;  Laterality: Right;  LMA   COLONOSCOPY  01/22/2023   CYSTOSCOPY N/A 02/25/2022   Procedure: PHYLLIS;  Surgeon: Cleotilde Ronal RAMAN, MD;  Location: [facility];  Service: Gynecology;  Laterality: N/A;   TOTAL LAPAROSCOPIC HYSTERECTOMY WITH SALPINGECTOMY Bilateral 02/25/2022   Procedure: TOTAL LAPAROSCOPIC HYSTERECTOMY WITH SALPINGECTOMY;  Surgeon: Cleotilde Ronal RAMAN, MD;  Location: [facility];  Service: Gynecology;  Laterality: Bilateral;   TUBAL LIGATION  2008    SOCIAL HISTORY: Social History   Socioeconomic History   Marital status: Married    Spouse name: Not on file   Number of children: 2   Years of education: Not on file   Highest education level: High school graduate  Occupational History   Occupation: homemaker  Tobacco Use   Smoking status: Never   Smokeless tobacco: Never  Vaping Use   Vaping status: Never Used  Substance and Sexual Activity   Alcohol use: Never   Drug use: Never   Sexual activity: Yes    Birth control/protection: Pill, Surgical    Comment: tubal ligation  Other Topics Concern   Not on file  Social History Narrative   Not on file   Social Drivers of Health   Financial Resource Strain: Not on file  Food Insecurity: No Food Insecurity (08/05/2021)   Hunger Vital Sign    Worried About Running Out of Food in the Last Year: Never true    Ran Out of Food in the Last Year: Never true  Transportation Needs:  No Transportation Needs (08/05/2021)   PRAPARE - Administrator, Civil Service (Medical): No    Lack of Transportation (Non-Medical): No  Physical Activity: Not on file  Stress: Not on file  Social Connections: Not on file  Intimate Partner Violence: Not At Risk (11/20/2021)   Humiliation, Afraid, Rape, and Kick questionnaire    Fear of Current or Ex-Partner: No    Emotionally Abused: No    Physically Abused: No    Sexually Abused: No    FAMILY HISTORY: Family History  Problem Relation Age of Onset   Heart disease Mother    Diabetes Mother    Heart attack Mother    Skin cancer Father        BCC   Diverticulosis Father    Fibroids Sister    Heart attack Maternal Uncle        <50   Diabetes Maternal Grandmother    Congestive Heart Failure Maternal Grandmother    Heart attack Maternal Grandfather    Heart attack Paternal Grandfather    Colon cancer Cousin    Colon polyps Neg Hx    Crohn's disease Neg Hx    Esophageal cancer Neg  Hx    Rectal cancer Neg Hx    Stomach cancer Neg Hx    Ulcerative colitis Neg Hx     Review of Systems  Constitutional:  Negative for appetite change, chills, fatigue, fever and unexpected weight change.  HENT:   Negative for hearing loss, lump/mass and trouble swallowing.   Eyes:  Negative for eye problems and icterus.  Respiratory:  Negative for chest tightness, cough and shortness of breath.   Cardiovascular:  Negative for chest pain, leg swelling and palpitations.  Gastrointestinal:  Negative for abdominal distention, abdominal pain, constipation, diarrhea, nausea and vomiting.  Endocrine: Positive for hot flashes.  Genitourinary:  Negative for difficulty urinating.   Musculoskeletal:  Negative for arthralgias.  Skin:  Negative for itching and rash.  Neurological:  Negative for dizziness, extremity weakness, headaches and numbness.  Hematological:  Negative for adenopathy. Does not bruise/bleed easily.  Psychiatric/Behavioral:   Negative for depression. The patient is not nervous/anxious.       PHYSICAL EXAMINATION     Vitals:   08/17/23 0851  BP: (!) 101/53  Pulse: 70  Resp: 18  Temp: 98.1 F (36.7 C)  SpO2: 100%    Physical Exam Constitutional:      General: She is not in acute distress.    Appearance: Normal appearance. She is not toxic-appearing.  HENT:     Head: Normocephalic and atraumatic.     Mouth/Throat:     Mouth: Mucous membranes are moist.     Pharynx: Oropharynx is clear. No oropharyngeal exudate or posterior oropharyngeal erythema.  Eyes:     General: No scleral icterus. Cardiovascular:     Rate and Rhythm: Normal rate and regular rhythm.     Pulses: Normal pulses.     Heart sounds: Normal heart sounds.  Pulmonary:     Effort: Pulmonary effort is normal.     Breath sounds: Normal breath sounds.  Chest:     Comments: Right breast status postlumpectomy and radiation no sign of local recurrence left breast is benign Abdominal:     General: Abdomen is flat. Bowel sounds are normal. There is no distension.     Palpations: Abdomen is soft.     Tenderness: There is no abdominal tenderness.  Musculoskeletal:        General: No swelling.     Cervical back: Neck supple.  Lymphadenopathy:     Cervical: No cervical adenopathy.     Upper Body:     Right upper body: No axillary adenopathy.     Left upper body: No axillary adenopathy.  Skin:    General: Skin is warm and dry.     Findings: No rash.  Neurological:     General: No focal deficit present.     Mental Status: She is alert.  Psychiatric:        Mood and Affect: Mood normal.        Behavior: Behavior normal.     ASSESSMENT and THERAPY PLAN:   Ductal carcinoma in situ (DCIS) of right breast Noninvasive Breast Cancer No signs of recurrence on physical examination. Patient reports hot flashes and decreased libido on Tamoxifen . -Continue Tamoxifen . -Schedule annual mammogram in January/early February. -Provide patient  with information on diet, exercise, and stress management.  General Health Maintenance -Continue follow-up with primary care provider and stay up-to-date with cancer screenings. -Encourage intentional exercise and continued consumption of fruits and vegetables.    All questions were answered. The patient knows to call the clinic with any problems,  questions or concerns. We can certainly see the patient much sooner if necessary.  Total encounter time:20 minutes*in face-to-face visit time, chart review, lab review, care coordination, order entry, and documentation of the encounter time.    Morna Kendall, NP 08/20/23 4:23 PM Medical Oncology and Hematology [facility] [address] [place], [place] [address] Tel. [phone]    Fax. [phone]  *Total Encounter Time as defined by the Centers for Medicare and Medicaid Services includes, in addition to the face-to-face time of a patient visit (documented in the note above) non-face-to-face time: obtaining and reviewing outside history, ordering and reviewing medications, tests or procedures, care coordination (communications with other health care professionals or caregivers) and documentation in the medical record.

## 2023-08-19 ENCOUNTER — Telehealth: Payer: Self-pay | Admitting: Hematology and Oncology

## 2023-08-19 NOTE — Telephone Encounter (Signed)
 Spoke with patient confirming upcoming appointment

## 2023-08-20 ENCOUNTER — Encounter: Payer: Self-pay | Admitting: Hematology and Oncology

## 2023-08-20 NOTE — Assessment & Plan Note (Signed)
 Noninvasive Breast Cancer No signs of recurrence on physical examination. Patient reports hot flashes and decreased libido on Tamoxifen . -Continue Tamoxifen . -Schedule annual mammogram in January/early February. -Provide patient with information on diet, exercise, and stress management.  General Health Maintenance -Continue follow-up with primary care provider and stay up-to-date with cancer screenings. -Encourage intentional exercise and continued consumption of fruits and vegetables.

## 2023-08-27 ENCOUNTER — Encounter (HOSPITAL_BASED_OUTPATIENT_CLINIC_OR_DEPARTMENT_OTHER): Payer: Self-pay | Admitting: Obstetrics & Gynecology

## 2023-08-27 ENCOUNTER — Encounter: Payer: Self-pay | Admitting: Hematology and Oncology

## 2023-11-26 ENCOUNTER — Encounter: Payer: Self-pay | Admitting: Adult Health

## 2024-01-05 ENCOUNTER — Encounter: Payer: Self-pay | Admitting: Hematology and Oncology

## 2024-01-05 ENCOUNTER — Encounter: Payer: Self-pay | Admitting: Adult Health

## 2024-01-05 ENCOUNTER — Other Ambulatory Visit: Payer: Self-pay | Admitting: *Deleted

## 2024-01-05 DIAGNOSIS — D0511 Intraductal carcinoma in situ of right breast: Secondary | ICD-10-CM

## 2024-01-05 NOTE — Progress Notes (Signed)
 Received mychart message from pt stating since her insurance has changed, mammogram orders need to be sent to [facility].  RN successfully faxed order.

## 2024-01-12 ENCOUNTER — Encounter: Payer: Self-pay | Admitting: Radiation Oncology

## 2024-01-13 ENCOUNTER — Ambulatory Visit
Admission: RE | Admit: 2024-01-13 | Discharge: 2024-01-13 | Disposition: A | Discharge status: Home / Self Care | Payer: Commercial Managed Care - HMO | Source: Ambulatory Visit | Attending: Radiation Oncology | Admitting: Radiation Oncology

## 2024-01-13 ENCOUNTER — Encounter: Payer: Self-pay | Admitting: Radiation Oncology

## 2024-01-13 VITALS — BP 122/87 | HR 91 | Temp 97.4°F | Resp 16 | Wt 119.0 lb

## 2024-01-13 DIAGNOSIS — Z170 Estrogen receptor positive status [ER+]: Secondary | ICD-10-CM | POA: Diagnosis not present

## 2024-01-13 DIAGNOSIS — Z923 Personal history of irradiation: Secondary | ICD-10-CM | POA: Diagnosis not present

## 2024-01-13 DIAGNOSIS — Z79810 Long term (current) use of selective estrogen receptor modulators (SERMs): Secondary | ICD-10-CM | POA: Diagnosis not present

## 2024-01-13 DIAGNOSIS — D0511 Intraductal carcinoma in situ of right breast: Secondary | ICD-10-CM | POA: Insufficient documentation

## 2024-01-13 NOTE — Progress Notes (Signed)
 Radiation Oncology Follow up Note  Name: Frances Craig   Date:   01/13/2024 MRN:  [id number] DOB: [birth year]    This 48 y.o. female presents to the clinic today for over 2-year follow-up status post whole breast radiation to her right breast for ER positive ductal carcinoma in situ.  REFERRING PROVIDER: No ref. provider found  HPI: Patient is a 48 year old female now out over 2 years having completed whole breast radiation to her right breast for ductal carcinoma in situ ER positive.  Seen today in routine follow-up she is doing well.  She specifically denies breast tenderness cough or bone pain.  She had mammograms yesterday.  At [facility] which were BI-RADS 2 benign.  She is currently on tamoxifen  tolerating it well without side effect.  COMPLICATIONS OF TREATMENT: none  FOLLOW UP COMPLIANCE: keeps appointments   PHYSICAL EXAM:  BP 122/87   Pulse 91   Temp (!) 97.4 F (36.3 C) (Tympanic)   Resp 16   Wt 119 lb (54 kg)   LMP 07/16/2021 (Exact Date)   BMI 22.48 kg/m  Lungs are clear to A&P cardiac examination essentially unremarkable with regular rate and rhythm. No dominant mass or nodularity is noted in either breast in 2 positions examined. Incision is well-healed. No axillary or supraclavicular adenopathy is appreciated. Cosmetic result is excellent.  Well-developed well-nourished patient in NAD. HEENT reveals PERLA, EOMI, discs not visualized.  Oral cavity is clear. No oral mucosal lesions are identified. Neck is clear without evidence of cervical or supraclavicular adenopathy. Lungs are clear to A&P. Cardiac examination is essentially unremarkable with regular rate and rhythm without murmur rub or thrill. Abdomen is benign with no organomegaly or masses noted. Motor sensory and DTR levels are equal and symmetric in the upper and lower extremities. Cranial nerves II through XII are grossly intact. Proprioception is intact. No peripheral adenopathy or edema is identified. No motor or  sensory levels are noted. Crude visual fields are within normal range.  RADIOLOGY RESULTS: Mammogram report reviewed  PLAN: Present time patient is doing well out over 2 years with no evidence of disease.  I am pleased with her overall progress.  I will turn follow-up care over to Dr. Alwin Baars.  To be happy to reevaluate the patient anytime should that be indicated.  Patient knows to call with any concerns.  I would like to take this opportunity to thank you for allowing me to participate in the care of your patient.Glenis Langdon, MD

## 2024-02-11 ENCOUNTER — Telehealth: Payer: Self-pay

## 2024-02-11 NOTE — Telephone Encounter (Signed)
 Left a message to confirm appt for 6/30

## 2024-02-14 ENCOUNTER — Inpatient Hospital Stay: Payer: Commercial Managed Care - HMO | Attending: Hematology and Oncology | Admitting: Hematology and Oncology

## 2024-02-14 ENCOUNTER — Inpatient Hospital Stay

## 2024-02-14 ENCOUNTER — Other Ambulatory Visit: Payer: Self-pay | Admitting: Hematology and Oncology

## 2024-02-14 VITALS — BP 114/70 | HR 75 | Temp 98.7°F | Resp 17 | Wt 119.4 lb

## 2024-02-14 DIAGNOSIS — D0511 Intraductal carcinoma in situ of right breast: Secondary | ICD-10-CM | POA: Insufficient documentation

## 2024-02-14 DIAGNOSIS — D509 Iron deficiency anemia, unspecified: Secondary | ICD-10-CM | POA: Insufficient documentation

## 2024-02-14 DIAGNOSIS — Z170 Estrogen receptor positive status [ER+]: Secondary | ICD-10-CM | POA: Insufficient documentation

## 2024-02-14 DIAGNOSIS — Z79810 Long term (current) use of selective estrogen receptor modulators (SERMs): Secondary | ICD-10-CM | POA: Insufficient documentation

## 2024-02-14 LAB — CMP (CANCER CENTER ONLY)
ALT: 11 U/L (ref 0–44)
AST: 12 U/L — ABNORMAL LOW (ref 15–41)
Albumin: 4.3 g/dL (ref 3.5–5.0)
Alkaline Phosphatase: 55 U/L (ref 38–126)
Anion gap: 6 (ref 5–15)
BUN: 10 mg/dL (ref 6–20)
CO2: 27 mmol/L (ref 22–32)
Calcium: 9.2 mg/dL (ref 8.9–10.3)
Chloride: 107 mmol/L (ref 98–111)
Creatinine: 0.8 mg/dL (ref 0.44–1.00)
GFR, Estimated: >60 mL/min (ref >60)
Glucose, Bld: 97 mg/dL (ref 70–99)
Potassium: 4.3 mmol/L (ref 3.5–5.1)
Sodium: 140 mmol/L (ref 135–145)
Total Bilirubin: 0.4 mg/dL (ref 0.0–1.2)
Total Protein: 6.6 g/dL (ref 6.5–8.1)

## 2024-02-14 LAB — FERRITIN: Ferritin: 60 ng/mL (ref 11–307)

## 2024-02-14 LAB — CBC WITH DIFFERENTIAL/PLATELET
Abs Immature Granulocytes: 0.01 10*3/uL (ref 0.00–0.07)
Basophils Absolute: 0.1 10*3/uL (ref 0.0–0.1)
Basophils Relative: 1 %
Eosinophils Absolute: 0.2 10*3/uL (ref 0.0–0.5)
Eosinophils Relative: 4 %
HCT: 40 % (ref 36.0–46.0)
Hemoglobin: 12.9 g/dL (ref 12.0–15.0)
Immature Granulocytes: 0 %
Lymphocytes Relative: 21 %
Lymphs Abs: 0.9 10*3/uL (ref 0.7–4.0)
MCH: 28.5 pg (ref 26.0–34.0)
MCHC: 32.3 g/dL (ref 30.0–36.0)
MCV: 88.3 fL (ref 80.0–100.0)
Monocytes Absolute: 0.3 10*3/uL (ref 0.1–1.0)
Monocytes Relative: 6 %
Neutro Abs: 2.9 10*3/uL (ref 1.7–7.7)
Neutrophils Relative %: 68 %
Platelets: 238 10*3/uL (ref 150–400)
RBC: 4.53 MIL/uL (ref 3.87–5.11)
RDW: 12.9 % (ref 11.5–15.5)
WBC: 4.3 10*3/uL (ref 4.0–10.5)
nRBC: 0 % (ref 0.0–0.2)

## 2024-02-14 LAB — IRON AND IRON BINDING CAPACITY (CC-WL,HP ONLY)
Iron: 106 ug/dL (ref 28–170)
Saturation Ratios: 27 % (ref 10.4–31.8)
TIBC: 396 ug/dL (ref 250–450)
UIBC: 290 ug/dL (ref 148–442)

## 2024-02-14 NOTE — Assessment & Plan Note (Addendum)
  Assessment & Plan Noninvasive Breast Cancer on tamoxifen   Breast cancer managed with tamoxifen . Mild side effects noted. No recurrence or new masses.  - Continue tamoxifen  therapy. Refill in December. - Order annual blood work for liver enzymes. - Order ferritin level to follow up on IDA. She had severe iron  deficiency before the hysterectomy - Schedule mammogram at [facility] - Follow up in one year.

## 2024-02-14 NOTE — Progress Notes (Signed)
 [facility] CONSULT NOTE  Patient Care Team: Pcp, No as PCP - General Cleotilde Ronal RAMAN, MD as Obstetrician (Obstetrics and Gynecology) Vernetta Berg, MD as Consulting Physician (General Surgery) Loretha Ash, MD as Consulting Physician (Hematology and Oncology) Lenn Aran, MD as Consulting Physician (Radiation Oncology)  CHIEF COMPLAINTS/PURPOSE OF CONSULTATION:  Anemia follow up, History of breast cancer  HISTORY OF PRESENTING ILLNESS:  Frances Craig 48 y.o. female is here because of recent diagnosis of right breast DCIS and LCIS  Patient had a diagnostic mammogram bilaterally which showed multiple abnormal findings in the right breast which showed 2.4 x 1 x 3.7 cm irregular spiculated mass at the 10:30 position 3 cm from the nipple, no abnormal right axillary lymph nodes noted.  A 4 x 3 x 4 mm benign complicated cyst at the 5 o'clock position of the retroareolar breast, 1.2 x 1.7 x 1.0 cm benign simple cyst at the 10 o'clock position 1 cm from the nipple.  No sonographic abnormalities in the upper outer left breast.  Ultrasound-guided right breast biopsy was scheduled Ultrasound of the breast and biopsy showed complex sclerosing lesion with lobular neoplasia and calcifications, atypical lobular hyperplasia. 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  SUMMARY OF ONCOLOGIC HISTORY: Oncology History  Ductal carcinoma in situ (DCIS) of  right breast  11/06/2021 Cancer Staging   Staging form: Breast, AJCC 8th Edition - Clinical stage from 11/06/2021: Stage 0 (cTis (DCIS), cN0, cM0, ER+, PR+) - Signed by Crawford Morna Pickle, NP on 04/01/2022 Stage prefix: Initial diagnosis   11/06/2021 Surgery   Right breast lumpectomy: Lobular carcinoma in situ involving a complex sclerosing lesion, and the additional superior deep margin excision ductal carcinoma in situ was noted that was 1 cm ER/PR positive.   11/20/2021 Initial Diagnosis   Ductal carcinoma in situ (DCIS) of right breast   12/02/2021 Genetic Testing   Negative genetic testing on the CancerNext-Expanded+RNAinsight panel.  The report date is 12/02/2021.  The CancerNext-Expanded gene panel offered by [facility] and includes sequencing and rearrangement analysis for the following 77 genes: AIP, ALK, APC*, ATM*, AXIN2, BAP1, BARD1, BLM, BMPR1A, BRCA1*, BRCA2*, BRIP1*, CDC73, CDH1*, CDK4, CDKN1B, CDKN2A, CHEK2*, CTNNA1, DICER1, FANCC, FH, FLCN, GALNT12, KIF1B, LZTR1, MAX, MEN1, MET, MLH1*, MSH2*, MSH3, MSH6*, MUTYH*, NBN, NF1*, NF2, NTHL1, PALB2*, PHOX2B, PMS2*, POT1, PRKAR1A, PTCH1, PTEN*, RAD51C*, RAD51D*, RB1, RECQL, RET, SDHA, SDHAF2, SDHB, SDHC, SDHD, SMAD4, SMARCA4, SMARCB1, SMARCE1, STK11, SUFU, TMEM127, TP53*, TSC1, TSC2, VHL and XRCC2 (sequencing and deletion/duplication); EGFR, EGLN1, HOXB13, KIT, MITF, PDGFRA, POLD1, and POLE (sequencing only); EPCAM and GREM1 (deletion/duplication only). DNA and RNA analyses performed for * genes.    12/04/2021 - 01/01/2022 Radiation Therapy   Right breast: 42.56 Gy in 16 fractions, Boost: 10 GY in 5 fractions   02/2022 -  Anti-estrogen oral therapy   Tamoxifen     Interval History  Discussed the use of AI scribe software for clinical note transcription with the patient, who gave verbal consent to proceed.  History of Present Illness Frances Craig is a 48 year old female with breast cancer  who presents for a follow-up  visit.  She has been doing well since her last visit and continues to take tamoxifen  without any new side effects. She experiences mild sleep disturbances, hot flashes, and dryness, which are common with tamoxifen . No changes in breast condition have been noted, but she does experience tightness and pain in the breast area when stretching.  She underwent a hysterectomy in the past due to heavy menstruation, which resolved her need for iron  supplementation. Since the procedure, she has been feeling fine and has not required further iron  treatment.  She is currently taking tamoxifen  and Flonase. She is unsure about the number of refills left for tamoxifen  but believes she is nearing the end of her third bottle. She has not had any blood work done in the past year and a half since her hysterectomy, as she has been feeling well and was released from care by her previous doctor.  Rest of the pertinent 10 point ROS reviewed and negative  MEDICAL HISTORY:  Past Medical History:  Diagnosis Date   Anemia    Cancer (HCC) 11/06/2021   right breast cancer- lobular carcinoma & ductal carcinoma / see 11/06/21 pathology report from right breast lumpectomy in Epic   COVID-19    late 2021 or early 2022 per pt, only mild symptoms   GERD (gastroesophageal reflux disease)    History of anemia 2022   chronic anemia, 10/28/21 Hgb 7.9 in Epic, 12/29/21 Hgb 8.9 / Patient has been receiving weekly iron  infusions, 12/15/21 - 12/29/21. Patient received 5 more iron  infusions in 01/2022.   Hx of radiation therapy    12/04/21 - 01/01/22 as of 12/31/21, radiation to right breast   Wears contact lenses    Wears glasses     SURGICAL HISTORY: Past Surgical History:  Procedure Laterality Date   BREAST LUMPECTOMY WITH RADIOACTIVE SEED LOCALIZATION Right 11/06/2021   Procedure: RIGHT BREAST LUMPECTOMY WITH RADIOACTIVE SEED LOCALIZATION;  Surgeon: Vernetta Berg, MD;  Location: [facility] OR;  Service: General;  Laterality: Right;  LMA    COLONOSCOPY  01/22/2023   CYSTOSCOPY N/A 02/25/2022   Procedure: PHYLLIS;  Surgeon: Cleotilde Ronal RAMAN, MD;  Location: [facility];  Service: Gynecology;  Laterality: N/A;   TOTAL LAPAROSCOPIC HYSTERECTOMY WITH SALPINGECTOMY Bilateral 02/25/2022   Procedure: TOTAL LAPAROSCOPIC HYSTERECTOMY WITH SALPINGECTOMY;  Surgeon: Cleotilde Ronal RAMAN, MD;  Location: [facility] ([facility]);  Service: Gynecology;  Laterality: Bilateral;   TUBAL LIGATION  2008    SOCIAL HISTORY: Social History   Socioeconomic History   Marital status: Married    Spouse name: Not on file   Number of children: 2   Years of education: Not on file   Highest education level: High school graduate  Occupational History   Occupation: homemaker  Tobacco Use   Smoking status: Never   Smokeless tobacco: Never  Vaping Use   Vaping status: Never Used  Substance and Sexual Activity   Alcohol use: Never   Drug use: Never   Sexual activity: Yes    Birth control/protection: Pill, Surgical    Comment: tubal ligation  Other Topics Concern   Not on file  Social History Narrative   Not on file   Social Drivers of Health   Financial Resource Strain: Not on file  Food Insecurity: No Food Insecurity (08/05/2021)   Hunger Vital Sign    Worried About Running Out of Food in the Last Year: Never true    Ran Out of Food in the Last  Year: Never true  Transportation Needs: No Transportation Needs (08/05/2021)   PRAPARE - Administrator, Civil Service (Medical): No    Lack of Transportation (Non-Medical): No  Physical Activity: Not on file  Stress: Not on file  Social Connections: Not on file  Intimate Partner Violence: Not At Risk (11/20/2021)   Humiliation, Afraid, Rape, and Kick questionnaire    Fear of Current or Ex-Partner: No    Emotionally Abused: No    Physically Abused: No    Sexually Abused: No    FAMILY HISTORY: Family History  Problem Relation Age of Onset   Heart disease Mother     Diabetes Mother    Heart attack Mother    Skin cancer Father        BCC   Diverticulosis Father    Fibroids Sister    Heart attack Maternal Uncle        <50   Diabetes Maternal Grandmother    Congestive Heart Failure Maternal Grandmother    Heart attack Maternal Grandfather    Heart attack Paternal Grandfather    Colon cancer Cousin    Colon polyps Neg Hx    Crohn's disease Neg Hx    Esophageal cancer Neg Hx    Rectal cancer Neg Hx    Stomach cancer Neg Hx    Ulcerative colitis Neg Hx     ALLERGIES:  is allergic to other.  MEDICATIONS:  Current Outpatient Medications  Medication Sig Dispense Refill   fluticasone (FLONASE) 50 MCG/ACT nasal spray Place into both nostrils daily.     tamoxifen  (NOLVADEX ) 20 MG tablet Take 1 tablet (20 mg total) by mouth daily. Started 03/11/22 90 tablet 3   No current facility-administered medications for this visit.    Rest of the pertinent 10 point ROS reviewed and negative  PHYSICAL EXAMINATION: ECOG PERFORMANCE STATUS: 0 - Asymptomatic  Vitals:   02/14/24 0938  BP: 114/70  Pulse: 75  Resp: 17  Temp: 98.7 F (37.1 C)  SpO2: 100%    Filed Weights   02/14/24 0938  Weight: 119 lb 6.4 oz (54.2 kg)    She appears well, no acute distress Bilateral breasts examined. No palpable masses or regional adenopathy.   LABORATORY DATA:  I have reviewed the data as listed Lab Results  Component Value Date   WBC 4.3 02/14/2024   HGB 12.9 02/14/2024   HCT 40.0 02/14/2024   MCV 88.3 02/14/2024   PLT 238 02/14/2024   Lab Results  Component Value Date   NA 140 02/14/2024   K 4.3 02/14/2024   CL 107 02/14/2024   CO2 27 02/14/2024   Labs from today reviewed CBC showed hemoglobin of 8.7, microcytic, hypochromic, mild thrombocytosis Iron  panel showed a TIBC of 535, iron  saturation of 5, ferritin pending from today  RADIOGRAPHIC STUDIES: I have personally reviewed the radiological reports and agreed with the findings in the  report.  ASSESSMENT AND PLAN:  Ductal carcinoma in situ (DCIS) of right breast  Assessment & Plan Noninvasive Breast Cancer on tamoxifen   Breast cancer managed with tamoxifen . Mild side effects noted. No recurrence or new masses.  - Continue tamoxifen  therapy. Refill in December. - Order annual blood work for liver enzymes. - Order ferritin level to follow up on IDA. She had severe iron  deficiency before the hysterectomy - Schedule mammogram at [facility] - Follow up in one year.     Total time spent: 20 minutes  All questions were answered. The patient knows to  call the clinic with any problems, questions or concerns.    Amber Stalls, MD 02/14/24

## 2024-09-03 ENCOUNTER — Other Ambulatory Visit: Payer: Self-pay | Admitting: Adult Health

## 2025-02-14 ENCOUNTER — Ambulatory Visit: Admitting: Hematology and Oncology

## 2025-02-14 ENCOUNTER — Other Ambulatory Visit
